# Patient Record
Sex: Male | Born: 1945 | Race: White | Hispanic: No | Marital: Single | Smoking: Former smoker
Health system: Southern US, Community
[De-identification: ages and names within clinical notes are randomized; demographics above are authoritative.]

## PROBLEM LIST (undated history)

## (undated) DIAGNOSIS — I4891 Unspecified atrial fibrillation: Secondary | ICD-10-CM

## (undated) HISTORY — PX: OTHER SURGICAL HISTORY: SHX169

## (undated) HISTORY — DX: Unspecified atrial fibrillation: I48.91

## (undated) HISTORY — PX: ABLATION: SHX5711

---

## 1998-09-30 ENCOUNTER — Ambulatory Visit (HOSPITAL_COMMUNITY): Admission: RE | Admit: 1998-09-30 | Discharge: 1998-09-30 | Payer: Self-pay | Admitting: Cardiovascular Disease

## 1998-10-14 ENCOUNTER — Inpatient Hospital Stay (HOSPITAL_COMMUNITY): Admission: AD | Admit: 1998-10-14 | Discharge: 1998-10-17 | Payer: Self-pay | Admitting: Cardiovascular Disease

## 1998-10-19 ENCOUNTER — Emergency Department (HOSPITAL_COMMUNITY): Admission: EM | Admit: 1998-10-19 | Discharge: 1998-10-19 | Payer: Self-pay | Admitting: Emergency Medicine

## 1998-10-26 ENCOUNTER — Emergency Department (HOSPITAL_COMMUNITY): Admission: EM | Admit: 1998-10-26 | Discharge: 1998-10-26 | Payer: Self-pay

## 1998-11-16 ENCOUNTER — Ambulatory Visit (HOSPITAL_COMMUNITY): Admission: RE | Admit: 1998-11-16 | Discharge: 1998-11-16 | Payer: Self-pay | Admitting: Cardiovascular Disease

## 2004-06-15 ENCOUNTER — Ambulatory Visit: Payer: Self-pay | Admitting: Cardiology

## 2004-06-19 ENCOUNTER — Ambulatory Visit: Payer: Self-pay

## 2004-06-19 ENCOUNTER — Ambulatory Visit: Payer: Self-pay | Admitting: Cardiology

## 2004-06-27 ENCOUNTER — Ambulatory Visit: Payer: Self-pay | Admitting: Cardiovascular Disease

## 2004-07-04 ENCOUNTER — Ambulatory Visit: Payer: Self-pay | Admitting: Cardiology

## 2004-07-10 ENCOUNTER — Ambulatory Visit: Payer: Self-pay | Admitting: Internal Medicine

## 2004-07-10 ENCOUNTER — Ambulatory Visit: Payer: Self-pay | Admitting: Cardiology

## 2004-07-18 ENCOUNTER — Ambulatory Visit: Payer: Self-pay | Admitting: Cardiology

## 2004-07-25 ENCOUNTER — Ambulatory Visit: Payer: Self-pay

## 2004-07-25 ENCOUNTER — Ambulatory Visit: Payer: Self-pay | Admitting: Cardiology

## 2004-08-01 ENCOUNTER — Ambulatory Visit: Payer: Self-pay | Admitting: Internal Medicine

## 2004-08-01 ENCOUNTER — Ambulatory Visit (HOSPITAL_COMMUNITY): Admission: RE | Admit: 2004-08-01 | Discharge: 2004-08-01 | Payer: Self-pay | Admitting: Internal Medicine

## 2004-08-08 ENCOUNTER — Ambulatory Visit: Payer: Self-pay | Admitting: Cardiology

## 2004-09-04 ENCOUNTER — Ambulatory Visit: Payer: Self-pay | Admitting: Internal Medicine

## 2006-07-22 ENCOUNTER — Ambulatory Visit (HOSPITAL_COMMUNITY): Admission: RE | Admit: 2006-07-22 | Discharge: 2006-07-22 | Payer: Self-pay | Admitting: *Deleted

## 2008-05-12 ENCOUNTER — Telehealth (INDEPENDENT_AMBULATORY_CARE_PROVIDER_SITE_OTHER): Payer: Self-pay | Admitting: *Deleted

## 2008-08-28 IMAGING — US US RENAL
1 series · 14 of 25 positions shown · non-contrast
Comparison: No prior exams are available for comparison.

CLINICAL DATA: 60-year-old with question of kidney stone.   Right flank pain for three days. Hematuria on [REDACTED].
 RENAL/URINARY TRACT ULTRASOUND:
TECHNIQUE: Complete ultrasound examination of the urinary tract was performed including evaluation of the kidneys, renal collecting systems, and urinary bladder.

[Series 1: unknown · 0.30mm/px · 14 of 47 slices shown]
[im 1/47]
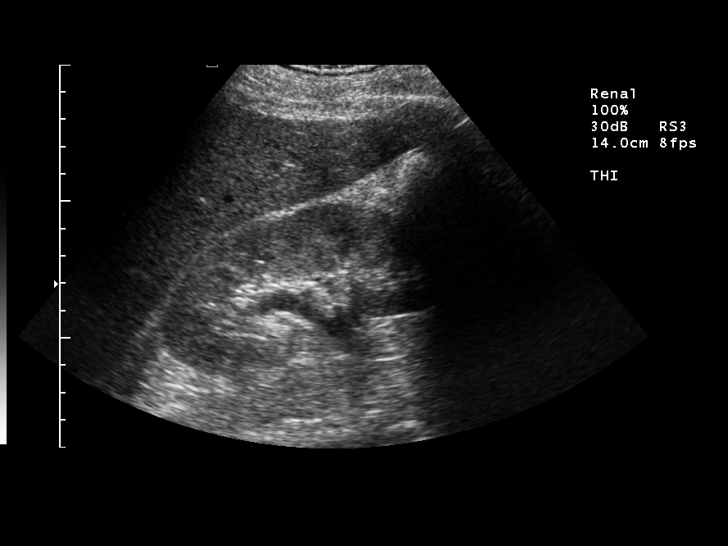
[im 4/47]
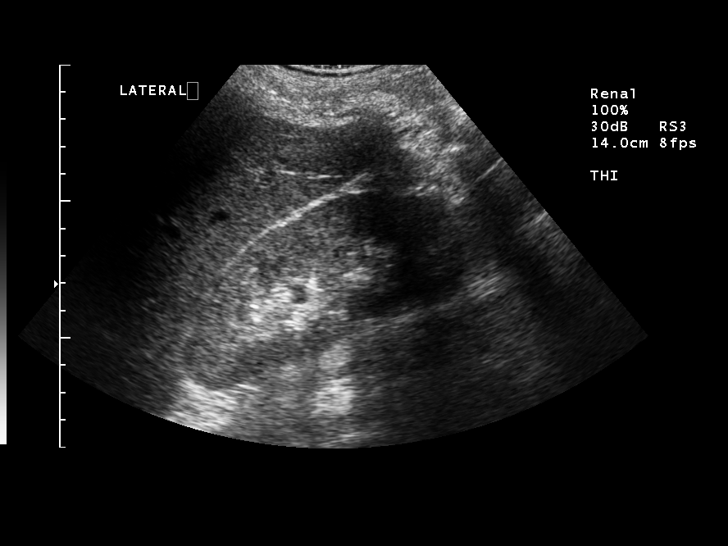
[im 8/47]
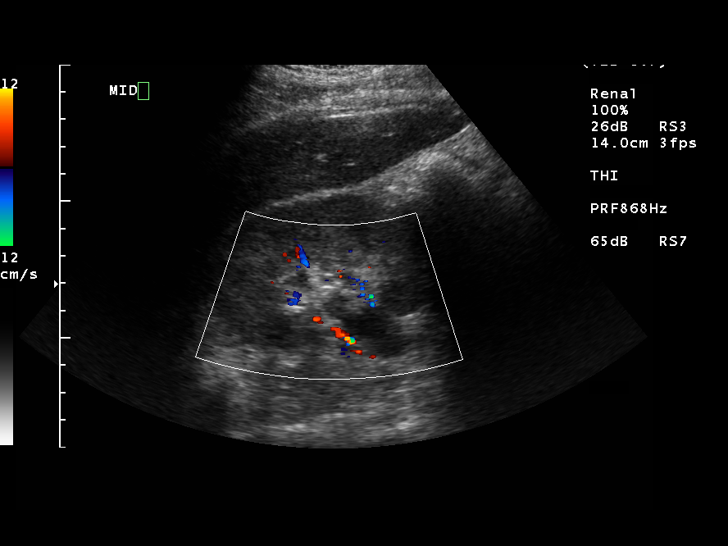
[im 12/47]
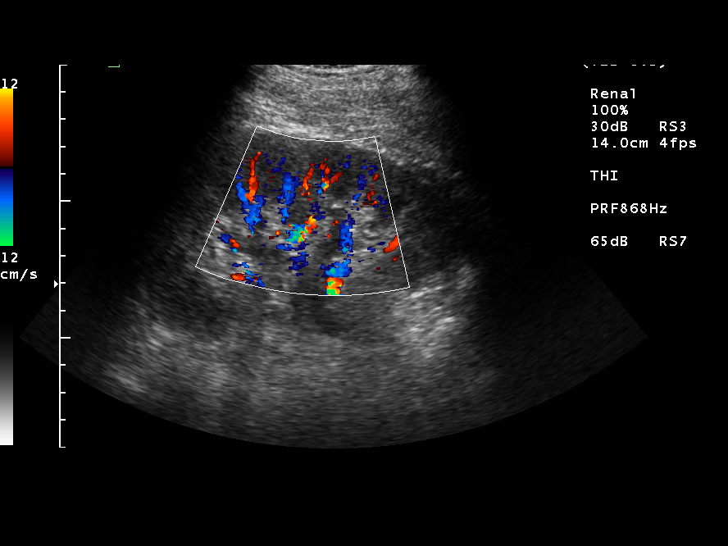
[im 16/47]
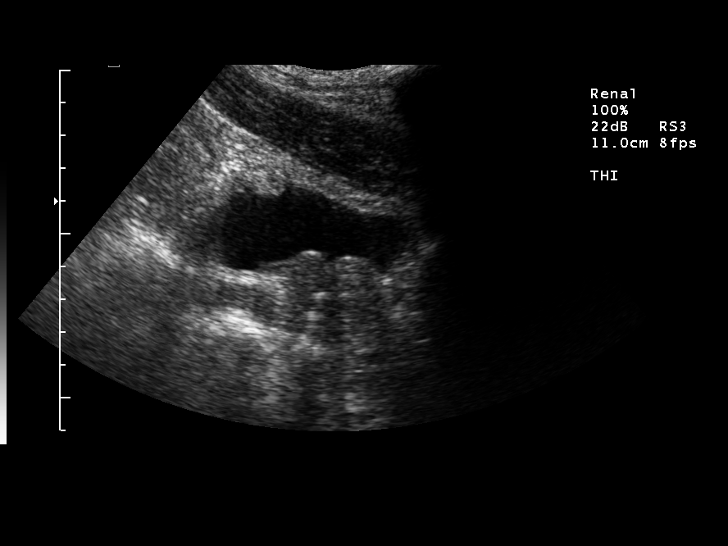
[im 18/47]
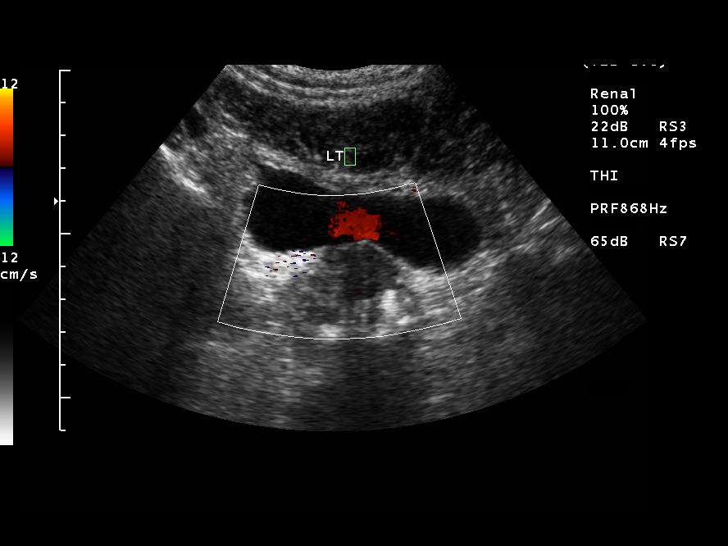
[im 22/47]
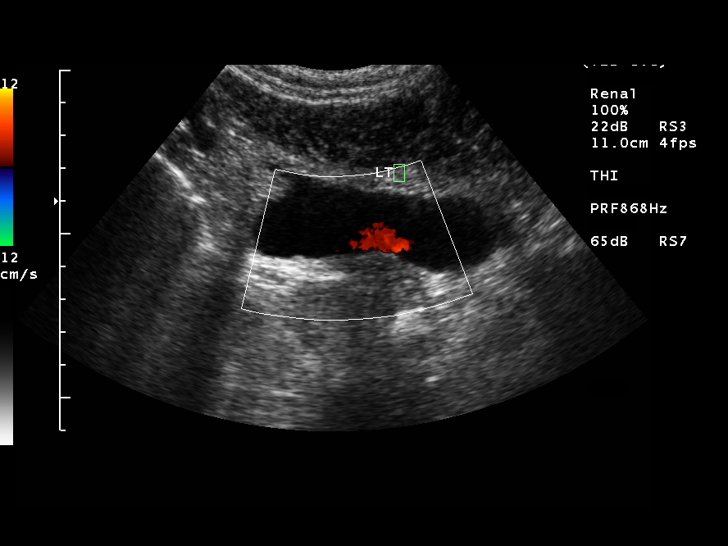
[im 25/47]
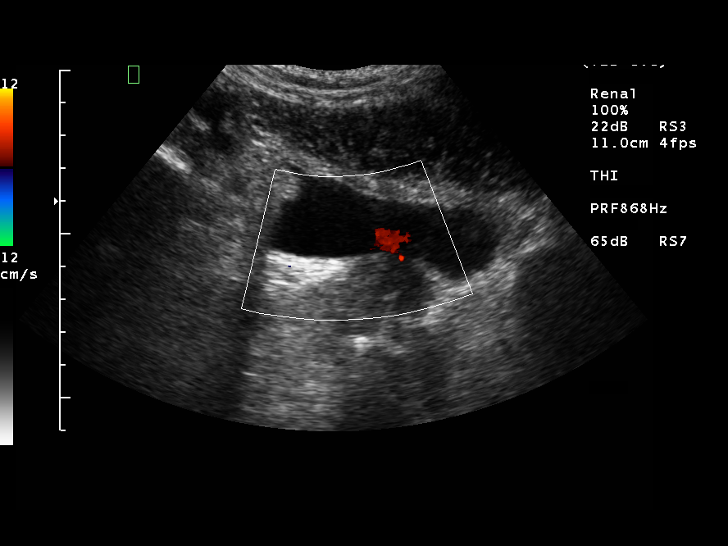
[im 29/47]
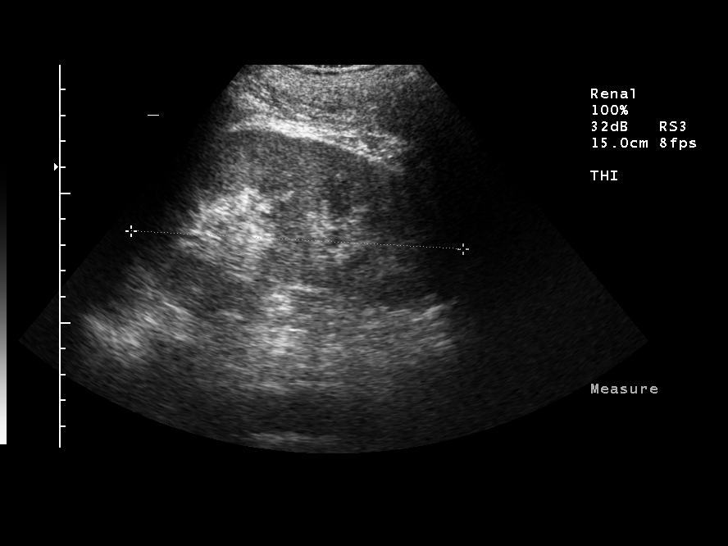
[im 31/47]
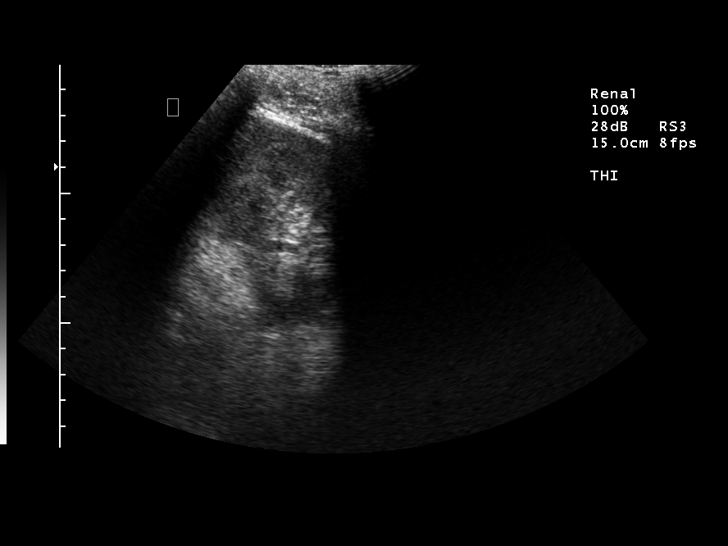
[im 35/47]
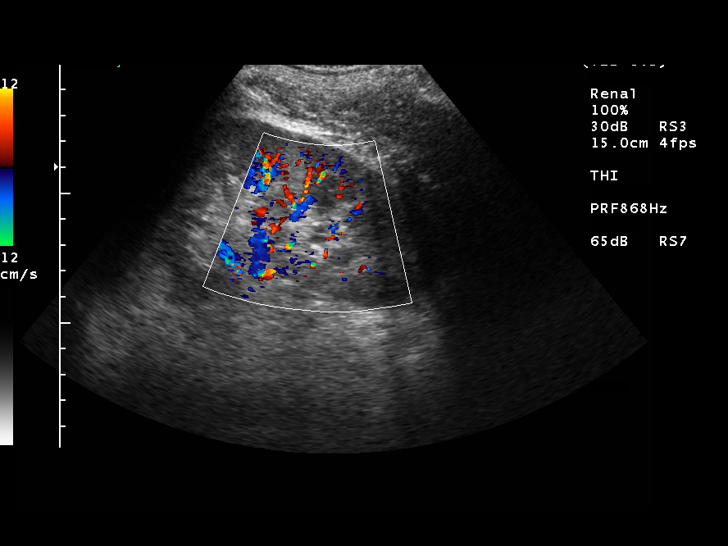
[im 39/47]
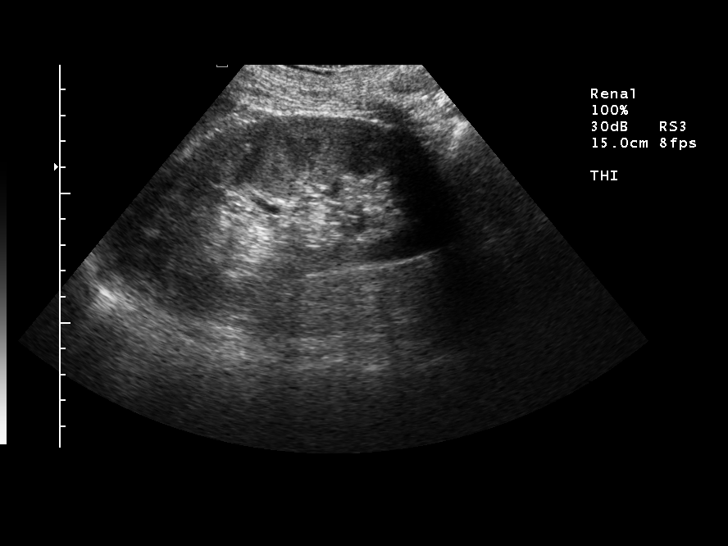
[im 43/47]
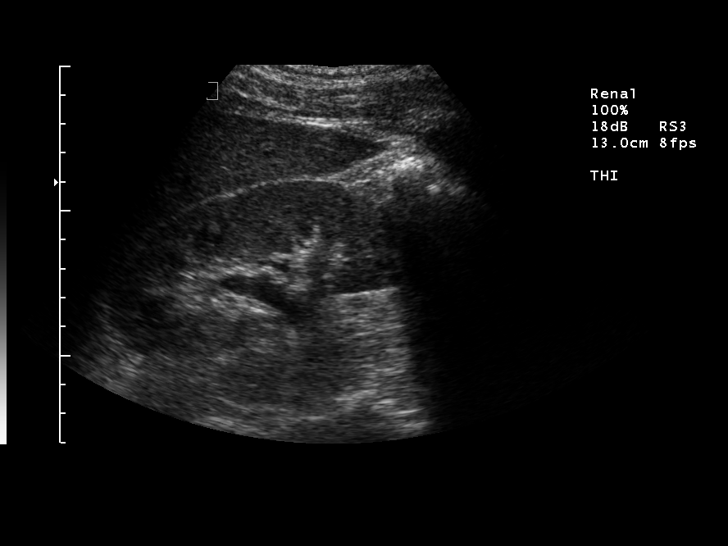
[im 47/47]
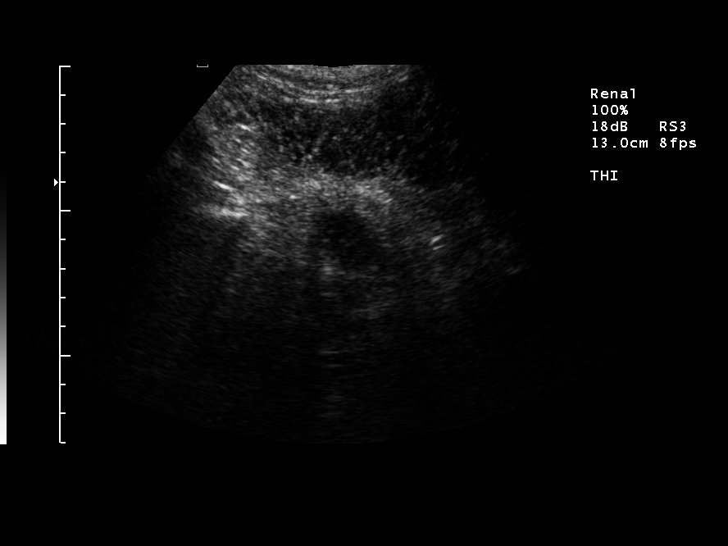

[14 of 25 positions shown; findings below may reference images not displayed]

FINDINGS: The right kidney is 12.4 cm in length. There is mild right hydronephrosis. Left kidney is 12.8 cm in length. No evidence for hydronephrosis on the left. No renal masses are identified. The bladder has a normal appearance. Bilateral ureteral jets are identified.
IMPRESSION: Mild right hydronephrosis. No evidence for renal mass. Presence of right ureteral jet excludes complete obstruction.

## 2010-05-26 NOTE — Op Note (Signed)
NAME:  Ryan Navarro, HORNBACK NO.:  [id number]   MEDICAL RECORD NO.:  [id number]          PATIENT TYPE:  OIB   LOCATION:  3729                         FACILITY:  [facility]   PHYSICIAN:  Duke Salvia, M.D.  DATE OF BIRTH:  15-Jul-1945   DATE OF PROCEDURE:  08/01/2004  DATE OF DISCHARGE:  08/01/2004                                 OPERATIVE REPORT   PREOPERATIVE DIAGNOSIS:  Atrial flutter.   POSTOPERATIVE DIAGNOSIS:  Sinus rhythm.   PROCEDURE:  Invasive electrophysiological study, arrhythmia mapping,  radiofrequency catheter ablation.   Following obtaining informed consent, the patient was brought to the  electrophysiology laboratory and placed on the fluoroscopic table in the  supine position.  After routine prep and drape, cardiac catheterization was  performed with local anesthesia and conscious sedation.  Noninvasive blood  pressure monitoring and transcutaneous oxygen saturation monitoring and end  tidal CO2 monitoring were performed continuously throughout the procedure.  Following the procedure, the catheters were removed, hemostasis was  obtained, and the patient was transferred to the floor in stable condition.   CATHETERS:  5 French quadripolar catheter was inserted via the left femoral  vein to the AV junction.  6 French octapolar catheter was inserted via the right femoral vein to the  coronary sinus.  7 [place] dual decapolar catheter was inserted via the left femoral vein to  the tricuspid annulus.  8 French 8 mm deflectable tip ablation catheter was inserted via the right  femoral vein using an SL2 sheath to map the sites into the posterior septal  space.   RESULTS:  Service electrocardiogram.  Initial rhythm is atrial flutter; AA interval 237 milliseconds; RR interval  675 milliseconds; QRS duration 73 milliseconds; QT interval 374  milliseconds; and unfortunately the internal electrogram measurements were  not recorded on the sheet.   Rhythm:   Sinus; RR interval 1293 milliseconds; PR 218 milliseconds; QRS  duration 103 milliseconds; QT interval 465 milliseconds; P wave duration 161  milliseconds; AH interval 155 milliseconds; HA interval 53 milliseconds.   AV Wenckebach was 400 milliseconds.  VA Wenckebach was 400 milliseconds.   The AV nodal affect was refractory at 600 milliseconds was 400 milliseconds  and AV nodal conduction was continuous.   No evidence of an accessory pathway was identified.   Ventricular response to programmed stimulation was normal for incremental  ventricular pacing.   Arrhythmias induced:  The patient presented to the lab in atrial flutter.  Electrogram mapping demonstrated counter clockwise rotation of the tricuspid  annulus.  Radiofrequency energy was delivered at the caval tricuspid isthmus  at approximately 6 o'clock on the annulus.  This interrupted atrial flutter  and subsequently resulted in the generation of bidirectional cavo-tricuspid  isthmus block.  Electrogram mapping further was undertaken to make sure that  all electrograms along the ablation line were obliterated.   Fluoroscopy time:  A total of 6 minutes and 56 seconds of fluoroscopy time  was utilized at 7 1/2 frames per second.   Radiofrequency energy:  A total of 5 minutes and 52 seconds of RF energy was  applied.  IMPRESSION:  1.  Normal sinus function apart from bradycardia.  2.  Sustained atrial flutter.  3.  Normal AV nodal function.  4.  No accessory pathway.  5.  Normal His system function.  6.  Normal ventricular response to programmed stimulation.   SUMMARY AND CONCLUSION:  The results of electrophysiological testing  identified typical counter clockwise atrial flutter as the mechanism  underlying the patient's clinical arrhythmia.  Radiofrequency energy  delivered at the cavo-tricuspid isthmus at approximately 6 o'clock  interrupted atrial flutter and subsequently eliminated the substrate from  the patient's  atrial arrhythmias.  The patient will be observed overnight,  maintained on Coumadin for about three months, endocarditis prophylaxis will  be observed for the interim.       SCK/MEDQ  D:  08/04/2004  T:  08/04/2004  Job:  [id number]

## 2010-05-26 NOTE — Discharge Summary (Signed)
Ryan Navarro, Ryan Navarro NO.:  [id number]   MEDICAL RECORD NO.:  [id number]          PATIENT TYPE:  OIB   LOCATION:  3729                         FACILITY:  [facility]   PHYSICIAN:  Duke Salvia, M.D.  DATE OF BIRTH:  July 26, 1945   DATE OF ADMISSION:  08/01/2004  DATE OF DISCHARGE:  08/01/2004                                 DISCHARGE SUMMARY   DISCHARGE DIAGNOSIS:  1.  Discharging the same day as electrophysiology study with successful      radiofrequency catheter ablation of atypical atrial flutter.  2.  Recurrence of symptomatic atrial flutter (palpitations, decreased      exercise), despite Sotalol therapy.  3.  Previous DCCV.  4.  Coumadin therapy.  5.  Echocardiogram demonstrates normal left ventricular function.   PROCEDURE:  August 01, 2004, successful radiofrequency catheter ablation of  typical atrial flutter by Dr. Sherryl Manges, atrial flutter has a counter  clockwise rotation.   DISCHARGE DISPOSITION:  Ryan Navarro discharged the same day, he has no  particular pain at the catheterization site, he is maintaining sinus rhythm,  he goes home on Coumadin for the next three months.  He is able to shower.  He is to call [phone] if he experiences pain or swelling at the cath site.  He is asked not to drive for the next two days and not to engage in any  strenuous activities for the next two weeks.  For pain control, Tylenol 325  mg 1-2 tablets every 4-6 hours.   DISCHARGE MEDICATIONS:  He goes home on the following medications:  1.  Allopurinol 300 mg daily.  2.  Multi-vitamin daily.  3.  Enteric coated aspirin 325 mg daily.  4.  Coumadin to take as before this procedure.  5.  He is to discontinue Sotalol.   Of note, if he plans dental work even just teeth cleaning up to and through  December 2006, he is to call the office at [phone] for antibiotic coverage.   FOLLOW UP:  1.  [facility] at [address], Coumadin Clinic Tuesday,  August 1, at 8:45 a.m., and he will present to the Coumadin Clinic      Friday, August 25, at 11:30, and he will see Dr. Graciela Husbands the same day,      Friday, August 25, at 11:50.   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  He has undergone DC  cardioversions and has been treated with Sotalol.  He has done well on  Sotalol for approximately five years.  When in atrial flutter, he is quite  symptomatic, he has palpitations, feeling that his heart is churning and  twisting in his chest and, also, exercise intolerance.  His first  presentation was with chest pain in 2000.  Cardiolite study at that time was  negative.  A recent echocardiogram shows normal left ventricular function.  Electrocardiogram from June 15, 2004, shows atrial flutter that looks typical  and very possibly amenable to radiofrequency catheter ablation.  The  patient  was seen by Dr. Sherryl Manges.  The risks and benefits of ablation have been  discussed with the patient.  The patient wishes to proceed and, in this  case, if successful, he will no longer need to take Sotalol and after three  months can come off his Coumadin.   HOSPITAL COURSE:  The patient presented electively  July 25 for  radiofrequency catheter ablation of typical atrial flutter, this procedure  was successful.  The patient has maintained sinus rhythm although sinus  bradycardia in the post procedure period.  Discharged with the medications  and follow up as dictated above.  Temperature at the time of discharge is  97.5, blood pressure 108/72, pulse 45, oxygen saturation 97.5%.  His PT on  July 25 was 25.7, INR was 2.3.  Serum electrolytes show sodium 136,  potassium 3.8, chloride 103, carbonate 29, BUN 21, creatinine 0.9, glucose  113.  CBC showed white cells 5.8, hemoglobin 14.2, hematocrit 41.2,  platelets 202.      Debbora Lacrosse   GM/MEDQ  D:  08/01/2004  T:  08/01/2004  Job:   [id number]   cc:   Charlton Haws, M.D.

## 2010-05-26 NOTE — H&P (Signed)
Ryan Navarro, Ryan Navarro   MEDICAL RECORD NO.:  [id number]          PATIENT TYPE:  OIB   LOCATION:  2899                         FACILITY:  [facility]   PHYSICIAN:  Duke Salvia, M.D.  DATE OF BIRTH:  June 12, 1945   DATE OF ADMISSION:  08/01/2004  DATE OF DISCHARGE:                                HISTORY & PHYSICAL   PRESENTING CIRCUMSTANCE:  I'm here for atrial flutter ablation.   HISTORY OF PRESENT ILLNESS:  Ryan Navarro is a 65 year old male.  He has a  history of atrial flutter first diagnosed in 2000.  He has had cardioversion  in the past.  He has been treated with sotalol, which helped for about six  years according to the patient.  In the last couple of months he noted  recurrence.  He could tell because he felt his heart palpitating.  At rest  he feels at times a churning sensation in the heart, and he has decreased  exercise  tolerance, which he notes most especially when climbing stairs.  He  presented to his cardiologist, Charlton Haws, M.D., confirming recurrence of  atrial flutter despite sotalol therapy.  The patient was started on Coumadin  and has been on it for the last two to three months.  The patient has seen  Dr. Graciela Husbands in the office July 3, a recommendation of radiofrequency catheter  ablation as treatment and possible cure of atrial flutter with subsequent  discontinuance of sotalol and Coumadin.   ALLERGIES:  No known drug allergies.   MEDICATIONS:  1.  Allopurinol 300 mg daily.  2.  Multivitamin daily.  3.  Aspirin 325 mg daily.  4.  Sotalol 100 mg twice daily.  5.  Coumadin per pharmacy indication.   PAST MEDICAL HISTORY:  1.  History of atrial flutter with recent recurrence in the last two to      three months, causing palpitation and exercise intolerance.  2.  Sotalol therapy, which has been largely successful for the last six      years.  3.  Gout.   SOCIAL HISTORY:  The patient is unmarried.  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  He does  not smoke cigarettes, use alcohol or partake of recreational drugs.   REVIEW OF SYSTEMS:  The patient is not complaining of fevers, chills, night  sweats, uncontrolled weight loss or gain or adenopathy.  HEENT:  No  epistaxis, no hoarseness, no vertigo, no photophobia.  INTEGUMENT:  No  lesions, no nonhealing ulcerations.  CARDIOPULMONARY:  No history of chest  pain.  He has had an echocardiogram in the past, which showed normal left  ventricular function.  He is short of breath when exerting himself,  especially when he is experiencing atrial flutter.  He has no history of  presyncope or syncope, no lower extremity edema, no history of claudication.  He does have palpitation as described in the history of present illness.  UROGENITAL:  Nocturia but only one time a night.  No dysuria, no frequency,  no incontinence.  GASTROINTESTINAL:  No history of GI bleeding, no melena,  no bright red blood per rectum, no chronic heartburn.  NEUROPSYCHIATRIC:  No  unilateral weakness or numbness, no tingling, no anxiety or depression.  MUSCULOSKELETAL:  The patient has occasional outbreaks of gout, for which he  takes allopurinol and has good control.  He has no ongoing arthralgias or  effusions.  NEUROLOGIC:  No focal neurologic deficits, alert and oriented  x3.  Other systems all negative.   PHYSICAL EXAMINATION:  GENERAL:  The patient is alert and oriented x3.  He  is in no acute distress and comfortable.  HEENT:  Eyes:  Pupils equal, round, and reactive to light, extraocular  movements intact.  Nares without discharge.  NECK:  Supple, no jugular venous distention, no thyromegaly, no carotid  bruits auscultated.  CHEST:  Lungs are clear to auscultation and percussion bilaterally.  CARDIAC:  Regular rate and rhythm without murmur on examination this  morning.  ABDOMEN:  Soft, nondistended, bowel sounds are  present, no  hepatosplenomegaly.  Abdominal aorta is nonpulsatile, nonexpansile.  EXTREMITIES:  No evidence of clubbing, cyanosis, or edema.  Dorsalis pedis  pulses are 4/4 bilaterally.  Radial pulses are 4/4 bilaterally.   IMPRESSION:  1.  Recurrent atrial flutter in patient, which is symptomatic with      tachypalpitations and exercise intolerance.  2.  History of atrial flutter controlled for the past six years on sotalol      therapy.  3.  History of prior direct current cardioversion.  4.  2-D echocardiogram showing normal left ventricular function.  The      patient has also had a stress test in the past, which shows no evidence      of ischemia.  There is an electrocardiogram dated June 15, 2004, which      demonstrates atrial flutter, which is a typical flutter, mean heart rate      of 82.   PLAN:  Electrophysiology study and radiofrequency catheter ablation of  atrial flutter on August 01, 2004.      Debbora Lacrosse   GM/MEDQ  D:  08/01/2004  T:  08/01/2004  Job:  [id number]

## 2011-06-19 ENCOUNTER — Ambulatory Visit (INDEPENDENT_AMBULATORY_CARE_PROVIDER_SITE_OTHER): Payer: MEDICARE | Admitting: Family Medicine

## 2011-06-19 VITALS — BP 118/79 | HR 73 | Temp 97.4°F | Resp 18 | Ht 73.0 in | Wt 199.0 lb

## 2011-06-19 DIAGNOSIS — S9030XA Contusion of unspecified foot, initial encounter: Secondary | ICD-10-CM

## 2011-06-19 DIAGNOSIS — R609 Edema, unspecified: Secondary | ICD-10-CM

## 2011-06-19 DIAGNOSIS — R300 Dysuria: Secondary | ICD-10-CM

## 2011-06-19 LAB — POCT URINALYSIS DIPSTICK
Bilirubin, UA: NEGATIVE
Blood, UA: NEGATIVE
Glucose, UA: NEGATIVE
Ketones, UA: NEGATIVE
Leukocytes, UA: NEGATIVE
Nitrite, UA: NEGATIVE
Protein, UA: NEGATIVE
Spec Grav, UA: 1.015
Urobilinogen, UA: 0.2
pH, UA: 6.5

## 2011-06-19 LAB — POCT UA - MICROSCOPIC ONLY
Bacteria, U Microscopic: NEGATIVE
Casts, Ur, LPF, POC: NEGATIVE
Crystals, Ur, HPF, POC: NEGATIVE
Mucus, UA: NEGATIVE
Yeast, UA: NEGATIVE

## 2011-06-19 MED ORDER — DOXYCYCLINE HYCLATE 100 MG PO TABS: 100.0000 mg | ORAL_TABLET | Freq: Two times a day (BID) | ORAL | Status: AC

## 2011-06-19 NOTE — Progress Notes (Signed)
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 He had atrial fibrillation which was corrected with the ablation procedure but when he woke he had ecchymosis in his right groin where the puncture site was and subsequently ecchymosis all the way down his right leg. He also had some ecchymosis in the left thigh.  During the procedure he was catheterized, and following the procedure and subsequently he has had dysuria. He's had no fever.  Patient is concerned about 2 pustular areas on his right posterior calf as well as anterior erythema along the anterior tibial border. He has been concerned about the extent of the ecchymosis which is resolving somewhat and is now located primarily in the right foot. Patient has had urine and blood work to better characterize the leg swelling but this is been nondiagnostic. He also had a venous Doppler. This was negative  Objective: No acute distress Marked ecchymosis and swelling of the right foot with good pedal pulse. Nontender groin 2 pustular areas are noted in the right posterior calf and measure about 1/2 cm in diameter each.  Results for orders placed in visit on 06/19/11  POCT UA - MICROSCOPIC ONLY      Component Value Range   WBC, Ur, HPF, POC 0-1     RBC, urine, microscopic 0-1     Bacteria, U Microscopic neg     Mucus, UA neg     Epithelial cells, urine per micros 0-1     Crystals, Ur, HPF, POC neg     Casts, Ur, LPF, POC neg     Yeast, UA neg    POCT URINALYSIS DIPSTICK      Component Value Range   Color, UA yellow     Clarity, UA clear     Glucose, UA neg     Bilirubin, UA neg     Ketones, UA neg     Spec Grav, UA 1.015     Blood, UA neg     pH, UA 6.5     Protein, UA neg     Urobilinogen, UA 0.2     Nitrite, UA neg     Leukocytes, UA Negative     Assessment: Suspect patient has some urethral trauma and perhaps early MRSA  infection.  Plan: Doxycycline 100 twice a day x7 days, continue right leg elevation and recheck if symptoms persist or worsen He will make an appointment for followup.  We spent 40 minutes face-to-face discussing patient's problems and his need to get a primary care provider. He quit date: 10 different offices today without success. I agreed to take him on as a patient.

## 2011-06-21 LAB — URINE CULTURE
Colony Count: NO GROWTH
Organism ID, Bacteria: NO GROWTH

## 2011-09-11 ENCOUNTER — Other Ambulatory Visit: Payer: Self-pay | Admitting: Family Medicine

## 2011-09-21 DIAGNOSIS — I4891 Unspecified atrial fibrillation: Secondary | ICD-10-CM | POA: Insufficient documentation

## 2011-11-06 ENCOUNTER — Encounter: Payer: Self-pay | Admitting: Family Medicine

## 2011-11-06 DIAGNOSIS — I4891 Unspecified atrial fibrillation: Secondary | ICD-10-CM

## 2011-12-24 ENCOUNTER — Ambulatory Visit (INDEPENDENT_AMBULATORY_CARE_PROVIDER_SITE_OTHER): Payer: Medicare Other | Admitting: Family Medicine

## 2011-12-24 VITALS — BP 135/83 | HR 64 | Temp 98.0°F | Resp 16 | Ht 74.0 in | Wt 204.4 lb

## 2011-12-24 DIAGNOSIS — L089 Local infection of the skin and subcutaneous tissue, unspecified: Secondary | ICD-10-CM

## 2011-12-24 DIAGNOSIS — Z22322 Carrier or suspected carrier of Methicillin resistant Staphylococcus aureus: Secondary | ICD-10-CM

## 2011-12-24 MED ORDER — MUPIROCIN 2 % EX OINT: TOPICAL_OINTMENT | Freq: Three times a day (TID) | CUTANEOUS | Status: DC

## 2011-12-24 MED ORDER — DOXYCYCLINE HYCLATE 100 MG PO TABS: 100.0000 mg | ORAL_TABLET | Freq: Two times a day (BID) | ORAL | Status: DC

## 2011-12-24 NOTE — Progress Notes (Signed)
[facility]:  Office Visit  Chief Complaint:  Chief Complaint  Patient presents with  . Mass    bump on left upper knee history of MRSA been there since Thurs    HPI: Ryan Navarro. is a 66 y.o. male who complains of  MRSA skin infection, has had it x 3 years, this episode x 5 days. He squeezed it and started having paina nd was worse. He thinks he may have contracted it at the [facility] pool 3 years ago. Denies fevers, chills  Past Medical History  Diagnosis Date  . Atrial fibrillation    Past Surgical History  Procedure Date  . Ablation     May 28,2013   History   Social History  . Marital Status: Single    Spouse Name: N/A    Number of Children: N/A  . Years of Education: N/A   Social History Main Topics  . Smoking status: Former Smoker -- 2.0 packs/day for 20 years    Types: Cigarettes    Quit date: 11/19/1983  . Smokeless tobacco: None  . Alcohol Use: No  . Drug Use: No  . Sexually Active: None   Other Topics Concern  . None   Social History Narrative  . None   Family History  Problem Relation Age of Onset  . Cancer Mother   . Heart disease Father    No Known Allergies Prior to Admission medications   Medication Sig Start Date End Date Taking? Authorizing Provider  allopurinol (ZYLOPRIM) 100 MG tablet Take 100 mg by mouth daily.   Yes Historical Provider, MD  allopurinol (ZYLOPRIM) 300 MG tablet TAKE 1 TABLET BY MOUTH TWICE A DAY 09/11/11  Yes Ryan M Dunn, PA-C  dofetilide (TIKOSYN) 250 MCG capsule Take 250 mcg by mouth 2 (two) times daily.   Yes Historical Provider, MD  enalapril (VASOTEC) 5 MG tablet Take 5 mg by mouth daily.   Yes Historical Provider, MD  furosemide (LASIX) 20 MG tablet Take 10 mg by mouth daily.   Yes Historical Provider, MD  magnesium oxide (MAG-OX) 400 MG tablet Take 400 mg by mouth daily.   Yes Historical Provider, MD  Multiple Vitamins-Minerals (MULTIVITAMIN WITH MINERALS) tablet Take 1 tablet by mouth daily.    Yes Historical Provider, MD  rivaroxaban (XARELTO) 10 MG TABS tablet Take 15 mg by mouth daily.    Historical Provider, MD  Tamsulosin HCl (FLOMAX) 0.4 MG CAPS Take by mouth.    Historical Provider, MD     ROS: The patient denies fevers, chills, night sweats, unintentional weight loss, chest pain, palpitations, wheezing, dyspnea on exertion, nausea, vomiting, abdominal pain, dysuria, hematuria, melena, numbness, weakness, or tingling.   All other systems have been reviewed and were otherwise negative with the exception of those mentioned in the HPI and as above.    PHYSICAL EXAM: Filed Vitals:   12/24/11 1547  BP: 135/83  Pulse: 64  Temp: 98 F (36.7 C)  Resp: 16   Filed Vitals:   12/24/11 1547  Height: 6\' 2"  (1.88 m)  Weight: 204 lb 6.4 oz (92.715 kg)   Body mass index is 26.24 kg/(m^2).  General: Alert, no acute distress HEENT:  Normocephalic, atraumatic, oropharynx patent.  Cardiovascular:  Regular rate and rhythm, no rubs murmurs or gallops.  No Carotid bruits, radial pulse intact. No pedal edema.  Respiratory: Clear to auscultation bilaterally.  No wheezes, rales, or rhonchi.  No cyanosis, no use of accessory musculature GI: No organomegaly,  abdomen is soft and non-tender, positive bowel sounds.  No masses. Skin: + erythematous lesions on bilateral knees, left 1x1inches, right 10 mmx 10 mm, no dc Neurologic: Facial musculature symmetric. Psychiatric: Patient is appropriate throughout our interaction. Lymphatic: No cervical lymphadenopathy Musculoskeletal: Gait intact.   LABS: Results for orders placed in visit on 06/19/11  POCT UA - MICROSCOPIC ONLY      Component Value Range   WBC, Ur, HPF, POC 0-1     RBC, urine, microscopic 0-1     Bacteria, U Microscopic neg     Mucus, UA neg     Epithelial cells, urine per micros 0-1     Crystals, Ur, HPF, POC neg     Casts, Ur, LPF, POC neg     Yeast, UA neg    POCT URINALYSIS DIPSTICK      Component Value Range   Color,  UA yellow     Clarity, UA clear     Glucose, UA neg     Bilirubin, UA neg     Ketones, UA neg     Spec Grav, UA 1.015     Blood, UA neg     pH, UA 6.5     Protein, UA neg     Urobilinogen, UA 0.2     Nitrite, UA neg     Leukocytes, UA Negative    URINE CULTURE      Component Value Range   Colony Count NO GROWTH     Organism ID, Bacteria NO GROWTH       EKG/XRAY:   Primary read interpreted by Dr. Conley Rolls at [facility].   ASSESSMENT/PLAN: Encounter Diagnoses  Name Primary?  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 Skin infection    Rx Doxycycline Rx Bactroban HibaClens prn Avoid communal pool until resolves F/u prn    Shanterica Biehler PHUONG, DO 12/24/2011 4:21 PM

## 2012-01-14 ENCOUNTER — Other Ambulatory Visit: Payer: Self-pay | Admitting: Family Medicine

## 2012-02-19 ENCOUNTER — Other Ambulatory Visit: Payer: Self-pay | Admitting: Physician Assistant

## 2012-12-30 ENCOUNTER — Ambulatory Visit (INDEPENDENT_AMBULATORY_CARE_PROVIDER_SITE_OTHER): Payer: Medicare Other | Admitting: Family Medicine

## 2012-12-30 VITALS — BP 126/72 | HR 54 | Temp 98.7°F | Resp 14 | Ht 73.0 in | Wt 205.0 lb

## 2012-12-30 DIAGNOSIS — N419 Inflammatory disease of prostate, unspecified: Secondary | ICD-10-CM

## 2012-12-30 DIAGNOSIS — Z0000 Encounter for general adult medical examination without abnormal findings: Secondary | ICD-10-CM

## 2012-12-30 LAB — POCT URINALYSIS DIPSTICK
Bilirubin, UA: NEGATIVE
Blood, UA: NEGATIVE
Glucose, UA: NEGATIVE
Ketones, UA: NEGATIVE
Leukocytes, UA: NEGATIVE
Nitrite, UA: NEGATIVE
Protein, UA: NEGATIVE
Spec Grav, UA: 1.025
Urobilinogen, UA: 0.2
pH, UA: 6.5

## 2012-12-30 LAB — GLUCOSE, POCT (MANUAL RESULT ENTRY): POC Glucose: 71 mg/dl (ref 70–99)

## 2012-12-30 LAB — IFOBT (OCCULT BLOOD): IFOBT: POSITIVE

## 2012-12-30 MED ORDER — DOXYCYCLINE HYCLATE 100 MG PO TABS: 100.0000 mg | ORAL_TABLET | Freq: Two times a day (BID) | ORAL | Status: DC

## 2012-12-30 NOTE — Patient Instructions (Signed)
Benign Prostatic Hyperplasia An enlarged prostate (benign prostatic hyperplasia) is common in older men. You may experience the following:  Weak urine stream.  Dribbling.  Feeling like the bladder has not emptied completely.  Difficulty starting urination.  Getting up frequently at night to urinate.  Urinating more frequently during the day. HOME CARE INSTRUCTIONS  Monitor your prostatic hyperplasia for any changes. The following actions may help to alleviate any discomfort you are experiencing:  Give yourself time when you urinate.  Stay away from alcohol.  Avoid beverages containing caffeine, such as coffee, tea, and colas, because they can make the problem worse.  Avoid decongestants, antihistamines, and some prescription medicines that can make the problem worse.  Follow up with your health care provider for further treatment as recommended. SEEK MEDICAL CARE IF:  You are experiencing progressive difficulty voiding.  Your urine stream is progressively getting narrower.  You are awaking from sleep with the urge to void more frequently.  You are constantly feeling the need to void.  You experience loss of urine, especially in small amounts. SEEK IMMEDIATE MEDICAL CARE IF:   You develop increased pain with urination or are unable to urinate.  You develop severe abdominal pain, vomiting, a high fever, or fainting.  You develop back pain or blood in your urine. MAKE SURE YOU:   Understand these instructions.  Will watch your condition.  Will get help right away if you are not doing well or get worse. Document Released: 12/25/2004 Document Revised: 08/27/2012 Document Reviewed: 05/27/2012 [facility] Patient Information 2014 [place], [place]. Prostatitis The prostate gland is about the size and shape of a walnut. It is located just below your bladder. It produces one of the components of semen, which is made up of sperm and the fluids that help nourish and transport it  out from the testicles. Prostatitis is redness, soreness, and swelling (inflammation) of the prostate gland.  There are 3 types of prostatitis:  Acute bacterial prostatitis This is the least common type of prostatitis. It starts quickly and usually leads to a bladder infection. It can occur at any age.  Chronic bacterial prostatitis This is a persistent bacterial infection in the prostate. It usually develops from repeated acute bacterial prostatitis or acute bacterial prostatitis that was not properly treated. It can occur in men of any age but is most common in middle-aged men whose prostate has begun to enlarge.   Chronic prostatitis chronic pelvic pain syndrome This is the most common type of prostatitis. It is inflammation of the prostate gland that is not caused by a bacterial infection. The cause is unknown. CAUSES The cause of acute and chronic bacterial prostatitis is a bacterial infection. The exact cause of chronic prostatitis and chronic pelvic pain syndrome and asymptomatic inflammatory prostatitis is unknown.  SYMPTOMS  Symptoms can vary depending upon the type of prostatitis that exists. There can also be overlap in symptoms. Possible symptoms for each type of prostatitis are listed below. Acute bacterial prostatitis  Painful urination.  Fever or chills.  Muscle or joint pains.  Low back pain.  Low abdominal pain.  Inability to empty bladder completely.  Sudden urge to urinate.  Frequent urination.  Difficulty starting urine stream.  Weak urine stream.  Discharge from the urethra.  Dribbling after urination.  Rectal pain.  Pain in the testicles, penis, or tip of the penis.  Pain in the space between the anus and scrotum (perineum).  Problems with sexual function.  Painful ejaculation.  Bloody semen. Chronic  bacterial prostatitis  The symptoms are similar to those of acute bacterial prostatitis, but they usually are much less severe. Fever, chills,  and muscle and joint pain are not associated with chronic bacterial prostatitis. Chronic prostatitis chronic pelvic pain syndrome  Symptoms typically include a dull ache in the scrotum and the perineum. DIAGNOSIS  In order to diagnose prostatitis, your caregiver will ask about your symptoms. If acute or chronic bacterial prostatitis is suspected, a urine sample will be taken and tested (urinalysis). This is to see if there is bacteria in your urine. If the urinalysis result is negative for bacteria, your caregiver may use a finger to feel your prostate (digital rectal exam). This exam helps your caregiver determine if your prostate is swollen and tender. TREATMENT  Treatment for prostatitis depends on the cause. If a bacterial infection is the cause, it can be treated with antibiotic medicine. In cases of chronic bacterial prostatitis, the use of antibiotics for up to 1 month may be necessary. Your caregiver may instruct you to take sitz baths to help relieve pain. A sitz bath is a bath of hot water in which your hips and buttocks are under water. HOME CARE INSTRUCTIONS   Take all medicines as directed by your caregiver.  Take sitz baths as directed by your caregiver. SEEK MEDICAL CARE IF:   Your symptoms get worse, not better.  You have a fever. SEEK IMMEDIATE MEDICAL CARE IF:   You have chills.  You feel nauseous or vomit.  You feel lightheaded or faint.  You are unable to urinate.  You have blood or blood clots in your urine. Document Released: 12/23/1999 Document Revised: 03/19/2011 Document Reviewed: 07/14/2012 [facility] Patient Information 2014 [place], [place]. Health Maintenance, Males A healthy lifestyle and preventative care can promote health and wellness.  Maintain regular health, dental, and eye exams.  Eat a healthy diet. Foods like vegetables, fruits, whole grains, low-fat dairy products, and lean protein foods contain the nutrients you need without too many calories.  Decrease your intake of foods high in solid fats, added sugars, and salt. Get information about a proper diet from your caregiver, if necessary.  Regular physical exercise is one of the most important things you can do for your health. Most adults should get at least 150 minutes of moderate-intensity exercise (any activity that increases your heart rate and causes you to sweat) each week. In addition, most adults need muscle-strengthening exercises on 2 or more days a week.   Maintain a healthy weight. The body mass index (BMI) is a screening tool to identify possible weight problems. It provides an estimate of body fat based on height and weight. Your caregiver can help determine your BMI, and can help you achieve or maintain a healthy weight. For adults 20 years and older:  A BMI below 18.5 is considered underweight.  A BMI of 18.5 to 24.9 is normal.  A BMI of 25 to 29.9 is considered overweight.  A BMI of 30 and above is considered obese.  Maintain normal blood lipids and cholesterol by exercising and minimizing your intake of saturated fat. Eat a balanced diet with plenty of fruits and vegetables. Blood tests for lipids and cholesterol should begin at age 55 and be repeated every 5 years. If your lipid or cholesterol levels are high, you are over 50, or you are a high risk for heart disease, you may need your cholesterol levels checked more frequently.Ongoing high lipid and cholesterol levels should be treated with medicines, if  diet and exercise are not effective.  If you smoke, find out from your caregiver how to quit. If you do not use tobacco, do not start.  Lung cancer screening is recommended for adults aged 35 80 years who are at high risk for developing lung cancer because of a history of smoking. Yearly low-dose computed tomography (CT) is recommended for people who have at least a 30-pack-year history of smoking and are a current smoker or have quit within the past 15 years. A pack  year of smoking is smoking an average of 1 pack of cigarettes a day for 1 year (for example: 1 pack a day for 30 years or 2 packs a day for 15 years). Yearly screening should continue until the smoker has stopped smoking for at least 15 years. Yearly screening should also be stopped for people who develop a health problem that would prevent them from having lung cancer treatment.  If you choose to drink alcohol, do not exceed 2 drinks per day. One drink is considered to be 12 ounces (355 mL) of beer, 5 ounces (148 mL) of wine, or 1.5 ounces (44 mL) of liquor.  Avoid use of street drugs. Do not share needles with anyone. Ask for help if you need support or instructions about stopping the use of drugs.  High blood pressure causes heart disease and increases the risk of stroke. Blood pressure should be checked at least every 1 to 2 years. Ongoing high blood pressure should be treated with medicines if weight loss and exercise are not effective.  If you are 40 to 67 years old, ask your caregiver if you should take aspirin to prevent heart disease.  Diabetes screening involves taking a blood sample to check your fasting blood sugar level. This should be done once every 3 years, after age 81, if you are within normal weight and without risk factors for diabetes. Testing should be considered at a younger age or be carried out more frequently if you are overweight and have at least 1 risk factor for diabetes.  Colorectal cancer can be detected and often prevented. Most routine colorectal cancer screening begins at the age of 63 and continues through age 96. However, your caregiver may recommend screening at an earlier age if you have risk factors for colon cancer. On a yearly basis, your caregiver may provide home test kits to check for hidden blood in the stool. Use of a small camera at the end of a tube, to directly examine the colon (sigmoidoscopy or colonoscopy), can detect the earliest forms of colorectal  cancer. Talk to your caregiver about this at age 32, when routine screening begins. Direct examination of the colon should be repeated every 5 to 10 years through age 38, unless early forms of pre-cancerous polyps or small growths are found.  Hepatitis C blood testing is recommended for all people born from [birth year] through 1965 and any individual with known risks for hepatitis C.  Healthy men should no longer receive prostate-specific antigen (PSA) blood tests as part of routine cancer screening. Consult with your caregiver about prostate cancer screening.  Testicular cancer screening is not recommended for adolescents or adult males who have no symptoms. Screening includes self-exam, caregiver exam, and other screening tests. Consult with your caregiver about any symptoms you have or any concerns you have about testicular cancer.  Practice safe sex. Use condoms and avoid high-risk sexual practices to reduce the spread of sexually transmitted infections (STIs).  Use sunscreen. Apply sunscreen  liberally and repeatedly throughout the day. You should seek shade when your shadow is shorter than you. Protect yourself by wearing long sleeves, pants, a wide-brimmed hat, and sunglasses year round, whenever you are outdoors.  Notify your caregiver of new moles or changes in moles, especially if there is a change in shape or color. Also notify your caregiver if a mole is larger than the size of a pencil eraser.  A one-time screening for abdominal aortic aneurysm (AAA) and surgical repair of large AAAs by sound wave imaging (ultrasonography) is recommended for ages 40 to 8 years who are current or former smokers.  Stay current with your immunizations. Document Released: 06/23/2007 Document Revised: 04/21/2012 Document Reviewed: 05/22/2010 [facility] Patient Information 2014 [place], [place].

## 2012-12-30 NOTE — Progress Notes (Signed)
Subjective:    Patient ID: Ryan Lords., male    DOB: [birth year], 66 y.o.   MRN: [id number] This chart was scribed for Elvina Sidle, MD by Valera Castle, ED Scribe. This patient was seen in room 2 and the patient's care was started at 5:33 PM.  Chief Complaint  Patient presents with   Annual Exam    HPI Ryan Ambs. is a 67 y.o. male who presents to the [facility] requesting a physical exam. He cannot remember when he last had a physical. He reports recent surgery for atrial fibrillation. He reports his father died from heart disease. Pt denies having same disease. He denies h/o colonoscopy. He denies h/o flu vaccination.   Pt reports discomfort around his testicles. He reports he urinates frequently in the evenings, and states it feels like his testicles are swollen. He also reports that there has been some blood in his semen recently.   He reports that he swims and works out at [employer] 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 He is a former smoker, quit date 11/19/83, 2 PPD for 40 years.   He denies any other symptoms.   PCP Elvina Sidle, MD  Patient Active Problem List   Diagnosis Date Noted   Atrial fibrillation 09/21/2011   Past Medical History  Diagnosis Date   Atrial fibrillation    Past Surgical History  Procedure Laterality Date   Ablation      May 28,2013   No Known Allergies Prior to Admission medications   Medication Sig Start Date End Date Taking? Authorizing Provider  allopurinol (ZYLOPRIM) 300 MG tablet Take 1 tablet (300 mg total) by mouth 2 (two) times daily. Needs office visit/labs 02/19/12  Yes Heather M Marte, PA-C  dofetilide (TIKOSYN) 250 MCG capsule Take 250 mcg by mouth 2 (two) times daily.   Yes Historical Provider, MD  doxycycline (VIBRA-TABS) 100 MG tablet Take 1 tablet (100 mg total) by mouth 2 (two) times daily. 12/24/11  Yes Thao P Le, DO  enalapril (VASOTEC) 5 MG tablet Take 5 mg by mouth  daily.   Yes Historical Provider, MD  furosemide (LASIX) 20 MG tablet Take 10 mg by mouth daily.   Yes Historical Provider, MD  magnesium oxide (MAG-OX) 400 MG tablet Take 400 mg by mouth daily.   Yes Historical Provider, MD  Multiple Vitamins-Minerals (MULTIVITAMIN WITH MINERALS) tablet Take 1 tablet by mouth daily.   Yes Historical Provider, MD  allopurinol (ZYLOPRIM) 100 MG tablet Take 100 mg by mouth daily.    Historical Provider, MD  mupirocin ointment (BACTROBAN) 2 % Apply topically 3 (three) times daily. 12/24/11   Thao P Le, DO  rivaroxaban (XARELTO) 10 MG TABS tablet Take 15 mg by mouth daily.    Historical Provider, MD  Tamsulosin HCl (FLOMAX) 0.4 MG CAPS Take by mouth.    Historical Provider, MD   Family History  Problem Relation Age of Onset   Cancer Mother    Heart disease Father    History   Social History   Marital Status: Single    Spouse Name: N/A    Number of Children: N/A   Years of Education: N/A   Occupational History   Not on file.   Social History Main Topics   Smoking status: Former Smoker -- 2.00 packs/day for 20 years    Types: Cigarettes    Quit date: 11/19/1983   Smokeless tobacco: Not on file  Alcohol Use: No   Drug Use: No   Sexual Activity: Not on file   Other Topics Concern   Not on file   Social History Narrative   No narrative on file    Review of Systems  Genitourinary: Positive for frequency and scrotal swelling (bilateral). Negative for penile swelling and penile pain.       Blood in semen  All other systems reviewed and are negative.       Objective:   Physical Exam Nursing note and vitals reviewed. Constitutional: Pt is oriented to person, place, and time. Pt appears well-developed and well-nourished. No distress.  HENT:  Head: Normocephalic and atraumatic.  Eyes: EOM are normal. Pupils are equal, round, and reactive to light.  Neck: Neck supple. No tracheal deviation present.   BP 126/72   Pulse 54    Temp(Src) 98.7 F (37.1 C)   Resp 14   Ht 6\' 1"  (1.854 m)   Wt 205 lb (92.987 kg)   BMI 27.05 kg/m2   SpO2 95% General appearance: No acute distress, bearded man of average height, and trimmed body habitus HEENT: Unremarkable Neck: Supple no adenopathy or thyromegaly Chest: Clear Heart: Regular no murmur or gallop Abdomen: Soft nontender without HSM Genitalia: No hernia, normal testes and scrotum Rectal exam: Markedly enlarged boggy prostate, no hemorrhoids    Assessment & Plan:   Annual physical exam - Plan: POCT CBC, IFOBT POC (occult bld, rslt in office), POCT urinalysis dipstick, CBC with Differential, TSH, PSA, Lipid panel, Vit D  25 hydroxy (rtn osteoporosis monitoring), Ambulatory referral to Gastroenterology  Prostatitis - Plan: doxycycline (VIBRA-TABS) 100 MG tablet    Signed, Elvina Sidle, MD      I personally performed the services described in this documentation, which was scribed in my presence. The recorded information has been reviewed and is accurate.

## 2012-12-31 LAB — LIPID PANEL
Cholesterol: 230 mg/dL — ABNORMAL HIGH (ref 0–200)
HDL: 68 mg/dL (ref >39)
LDL Cholesterol: 140 mg/dL — ABNORMAL HIGH (ref 0–99)
Total CHOL/HDL Ratio: 3.4 Ratio
Triglycerides: 112 mg/dL (ref <150)
VLDL: 22 mg/dL (ref 0–40)

## 2012-12-31 LAB — CBC WITH DIFFERENTIAL/PLATELET
Basophils Absolute: 0 10*3/uL (ref 0.0–0.1)
Basophils Relative: 0 % (ref 0–1)
Eosinophils Absolute: 0.5 10*3/uL (ref 0.0–0.7)
Eosinophils Relative: 8 % — ABNORMAL HIGH (ref 0–5)
HCT: 42.5 % (ref 39.0–52.0)
Hemoglobin: 15.1 g/dL (ref 13.0–17.0)
Lymphocytes Relative: 22 % (ref 12–46)
Lymphs Abs: 1.3 10*3/uL (ref 0.7–4.0)
MCH: 31.3 pg (ref 26.0–34.0)
MCHC: 35.5 g/dL (ref 30.0–36.0)
MCV: 88 fL (ref 78.0–100.0)
Monocytes Absolute: 0.5 10*3/uL (ref 0.1–1.0)
Monocytes Relative: 9 % (ref 3–12)
Neutro Abs: 3.6 10*3/uL (ref 1.7–7.7)
Neutrophils Relative %: 61 % (ref 43–77)
Platelets: 234 10*3/uL (ref 150–400)
RBC: 4.83 MIL/uL (ref 4.22–5.81)
RDW: 14.3 % (ref 11.5–15.5)
WBC: 5.9 10*3/uL (ref 4.0–10.5)

## 2012-12-31 LAB — TSH: TSH: 3.13 u[IU]/mL (ref 0.350–4.500)

## 2012-12-31 LAB — PSA: PSA: 2.23 ng/mL (ref <=4.00)

## 2013-01-01 LAB — VITAMIN D 25 HYDROXY (VIT D DEFICIENCY, FRACTURES): Vit D, 25-Hydroxy: 42 ng/mL (ref 30–89)

## 2013-02-17 ENCOUNTER — Encounter: Payer: Self-pay | Admitting: Family Medicine

## 2013-03-25 ENCOUNTER — Encounter: Payer: Self-pay | Admitting: Family Medicine

## 2013-08-27 ENCOUNTER — Ambulatory Visit: Payer: Medicare Other | Admitting: Family Medicine

## 2013-08-27 ENCOUNTER — Encounter: Payer: Self-pay | Admitting: Family Medicine

## 2013-08-27 ENCOUNTER — Ambulatory Visit (INDEPENDENT_AMBULATORY_CARE_PROVIDER_SITE_OTHER): Payer: Commercial Managed Care - HMO | Admitting: Family Medicine

## 2013-08-27 VITALS — BP 120/76 | HR 64 | Temp 97.8°F | Resp 16 | Ht 73.5 in | Wt 191.0 lb

## 2013-08-27 DIAGNOSIS — N410 Acute prostatitis: Secondary | ICD-10-CM

## 2013-08-27 MED ORDER — DOXYCYCLINE HYCLATE 100 MG PO TABS: 100.0000 mg | ORAL_TABLET | Freq: Two times a day (BID) | ORAL | Status: DC

## 2013-08-27 NOTE — Patient Instructions (Signed)

## 2013-08-27 NOTE — Progress Notes (Signed)
This chart was scribed for Elvina Sidle, MD by Charline Bills, ED Scribe. The patient was seen in room 25. Patient's care was started at 12:14 PM.  Patient ID: Ryan Navarro. MRN: [id number], DOB: Dec 07, 1945, 68 y.o. Date of Encounter: 08/27/2013, 12:14 PM  Primary Physician: Elvina Sidle, MD  Chief Complaint: Groin Pain; Penis Pain; Hematospermia  HPI: 68 y.o. year old male with history below presents with groin pain onset 1 month ago. Pt reports associated penile pain, hematospermia and discomfort with urinating. He denies hematuria and fever. Pt is not currently taking antibiotics or blood thinners.  He had the same problem last December and it resolved with doxycycline.  He finds the blood in semen quite disconcerting.  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  He lives in [place] neighborhood  Last PSA test was done on 12/30/12 by Dr. Milus Glazier.   Past Medical History  Diagnosis Date  . Atrial fibrillation      Home Meds: Prior to Admission medications   Medication Sig Start Date End Date Taking? Authorizing Provider  allopurinol (ZYLOPRIM) 100 MG tablet Take 100 mg by mouth daily.    Historical Provider, MD  allopurinol (ZYLOPRIM) 300 MG tablet Take 1 tablet (300 mg total) by mouth 2 (two) times daily. Needs office visit/labs 02/19/12   Nelva Nay, PA-C  dofetilide (TIKOSYN) 250 MCG capsule Take 250 mcg by mouth 2 (two) times daily.    Historical Provider, MD  doxycycline (VIBRA-TABS) 100 MG tablet Take 1 tablet (100 mg total) by mouth 2 (two) times daily. 12/30/12   Elvina Sidle, MD  enalapril (VASOTEC) 5 MG tablet Take 5 mg by mouth daily.    Historical Provider, MD  furosemide (LASIX) 20 MG tablet Take 10 mg by mouth daily.    Historical Provider, MD  magnesium oxide (MAG-OX) 400 MG tablet Take 400 mg by mouth daily.    Historical Provider, MD  Multiple Vitamins-Minerals (MULTIVITAMIN WITH MINERALS) tablet Take 1 tablet by mouth daily.    Historical  Provider, MD  mupirocin ointment (BACTROBAN) 2 % Apply topically 3 (three) times daily. 12/24/11   Thao P Le, DO  rivaroxaban (XARELTO) 10 MG TABS tablet Take 15 mg by mouth daily.    Historical Provider, MD  Tamsulosin HCl (FLOMAX) 0.4 MG CAPS Take by mouth.    Historical Provider, MD    Allergies: No Known Allergies  History   Social History  . Marital Status: Single    Spouse Name: N/A    Number of Children: N/A  . Years of Education: N/A   Occupational History  . Not on file.   Social History Main Topics  . Smoking status: Former Smoker -- 2.00 packs/day for 20 years    Types: Cigarettes    Quit date: 11/19/1983  . Smokeless tobacco: Not on file  . Alcohol Use: No  . Drug Use: No  . Sexual Activity: Not on file   Other Topics Concern  . Not on file   Social History Narrative  . No narrative on file     Review of Systems: Constitutional: negative for chills, fever, night sweats, weight changes, or fatigue  HEENT: negative for vision changes, hearing loss, congestion, rhinorrhea, ST, epistaxis, or sinus pressure Cardiovascular: negative for chest pain or palpitations Respiratory: negative for hemoptysis, wheezing, shortness of breath, or cough Abdominal: negative for abdominal pain, nausea, vomiting, diarrhea, or constipation GU: negative hematuria, dysuria, +hematospermia, +penile pain, +groin pain Dermatological: negative for rash Neurologic: negative for headache, dizziness,  or syncope All other systems reviewed and are otherwise negative with the exception to those above and in the HPI.   Physical Exam: Blood pressure 120/76, pulse 64, temperature 97.8 F (36.6 C), resp. rate 16, height 6' 1.5" (1.867 m), weight 191 lb (86.637 kg), SpO2 94.00%., Body mass index is 24.86 kg/(m^2). General: Well developed, well nourished, in no acute distress. Head: Normocephalic, atraumatic, eyes without discharge, sclera non-icteric, nares are without discharge. Bilateral  auditory canals clear, TM's are without perforation, pearly grey and translucent with reflective cone of light bilaterally. Oral cavity moist, posterior pharynx without exudate, erythema, peritonsillar abscess, or post nasal drip.  Neck: Supple. No thyromegaly. Full ROM. No lymphadenopathy. Lungs: Clear bilaterally to auscultation without wheezes, rales, or rhonchi. Breathing is unlabored. Heart: RRR with S1 S2. No murmurs, rubs, or gallops appreciated. Abdomen: Soft, non-tender, non-distended with normoactive bowel sounds. No hepatomegaly. No rebound/guarding. No obvious abdominal masses. Msk:  Strength and tone normal for age. GU: moderately enlarged prostate with no nodularity. Normal genitalia. Extremities/Skin: Warm and dry. No clubbing or cyanosis. No edema. No rashes or suspicious lesions. Neuro: Alert and oriented X 3. Moves all extremities spontaneously. Gait is normal. CNII-XII grossly in tact. Psych:  Responds to questions appropriately with a normal affect.     ASSESSMENT AND PLAN:  68 y.o. year old male with Acute prostatitis - Plan: doxycycline (VIBRA-TABS) 100 MG tablet, PSA   Signed, Elvina SidleKurt Mariacristina Aday, MD 08/27/2013 12:14 PM

## 2013-08-28 LAB — PSA: PSA: 2.6 ng/mL (ref <=4.00)

## 2013-12-15 ENCOUNTER — Telehealth: Payer: Self-pay | Admitting: Family Medicine

## 2013-12-15 NOTE — Telephone Encounter (Signed)
Left a message for patient to return call.

## 2014-01-18 ENCOUNTER — Telehealth: Payer: Self-pay | Admitting: *Deleted

## 2014-01-18 NOTE — Telephone Encounter (Signed)
LMOM for pt to call the office regarding the flu shot.

## 2015-05-10 ENCOUNTER — Ambulatory Visit (INDEPENDENT_AMBULATORY_CARE_PROVIDER_SITE_OTHER): Payer: 59 | Admitting: Family Medicine

## 2015-05-10 VITALS — BP 138/94 | HR 104 | Resp 18 | Ht 73.5 in | Wt 197.8 lb

## 2015-05-10 DIAGNOSIS — J209 Acute bronchitis, unspecified: Secondary | ICD-10-CM

## 2015-05-10 DIAGNOSIS — J069 Acute upper respiratory infection, unspecified: Secondary | ICD-10-CM | POA: Diagnosis not present

## 2015-05-10 MED ORDER — RANITIDINE HCL 150 MG PO TABS: 150.0000 mg | ORAL_TABLET | Freq: Two times a day (BID) | ORAL | Status: DC

## 2015-05-10 MED ORDER — GUAIFENESIN-CODEINE 100-10 MG/5ML PO SOLN: 5.0000 mL | ORAL | Status: DC | PRN

## 2015-05-10 MED ORDER — GUAIFENESIN ER 1200 MG PO TB12: 1.0000 | ORAL_TABLET | Freq: Two times a day (BID) | ORAL | Status: DC | PRN

## 2015-05-10 MED ORDER — IPRATROPIUM BROMIDE 0.03 % NA SOLN: 2.0000 | Freq: Four times a day (QID) | NASAL | Status: DC

## 2015-05-10 MED ORDER — AMOXICILLIN 500 MG PO CAPS: 1000.0000 mg | ORAL_CAPSULE | Freq: Two times a day (BID) | ORAL | Status: DC

## 2015-05-10 NOTE — Progress Notes (Signed)
Subjective:  By signing my name below, I, Stann Oresung-Kai Tsai, attest that this documentation has been prepared under the direction and in the presence of Norberto SorensonEva Shaw, MD. Electronically Signed: Stann Oresung-Kai Tsai, Scribe. 05/10/2015 , 8:48 AM .  Patient was seen in Room 10 .   Patient ID: Ryan Lordshomas G Housand Jr., male    DOB: 1945/09/15, 70 y.o.   MRN: [id number] Chief Complaint  Patient presents with  . Sore Throat    x 1 week  . chest congestion    x 1 week   HPI Ryan Lordshomas G Picking Jr. is a 70 y.o. male who [facility] complaining of sore throat ongoing for a week now. He's also noticed some chest congestion with productive cough (yellow phlegm) that started a few days ago. He also mentions occasional joint ache and low grade headache with this. He's taken alka-seltzer without relief. He denies any known sick contact to strep. He denies shortness of breath or swelling in his legs.   He has a history of afib but denies any recent changes.  He's had tonsillectomy as a child.   Past Medical History  Diagnosis Date  . Atrial fibrillation (HCC)    Prior to Admission medications   Medication Sig Start Date End Date Taking? Authorizing Provider  allopurinol (ZYLOPRIM) 300 MG tablet Take 1 tablet (300 mg total) by mouth 2 (two) times daily. Needs office visit/labs 02/19/12  Yes Heather M Marte, PA-C  dofetilide (TIKOSYN) 250 MCG capsule Take 250 mcg by mouth 2 (two) times daily.   Yes Historical Provider, MD  enalapril (VASOTEC) 5 MG tablet Take 5 mg by mouth daily. Reported on 05/10/2015    Historical Provider, MD  furosemide (LASIX) 20 MG tablet Take 10 mg by mouth daily. Reported on 05/10/2015    Historical Provider, MD  magnesium oxide (MAG-OX) 400 MG tablet Take 400 mg by mouth daily. Reported on 05/10/2015    Historical Provider, MD  Multiple Vitamins-Minerals (MULTIVITAMIN WITH MINERALS) tablet Take 1 tablet by mouth daily.   Yes Historical Provider, MD   No Known Allergies  Review of Systems    Constitutional: Positive for fatigue. Negative for fever and chills.  HENT: Positive for congestion and sore throat.   Respiratory: Positive for cough and chest tightness. Negative for shortness of breath and wheezing.   Cardiovascular: Negative for chest pain, palpitations and leg swelling.  Neurological: Positive for headaches.       Objective:   Physical Exam  Constitutional: He is oriented to person, place, and time. He appears well-developed and well-nourished. No distress.  HENT:  Head: Normocephalic and atraumatic.  Right Ear: Tympanic membrane is erythematous and retracted.  Left Ear: Tympanic membrane is erythematous. A middle ear effusion is present.  Mouth/Throat: Posterior oropharyngeal erythema (with postnasal drip) present.  Nares with erythema bilaterally  Eyes: EOM are normal. Pupils are equal, round, and reactive to light.  Neck: Neck supple.  Cardiovascular: Normal rate, regular rhythm, S1 normal, S2 normal and normal heart sounds.   No murmur heard. Pulmonary/Chest: Effort normal. No respiratory distress. He has rales in the right lower field.  Rales RLF but cleared after deep breathing, otherwise good air movement  Musculoskeletal: Normal range of motion.  Neurological: He is alert and oriented to person, place, and time.  Skin: Skin is warm and dry.  Psychiatric: He has a normal mood and affect. His behavior is normal.  Nursing note and vitals reviewed.   BP 138/94 mmHg  Pulse 104  Resp 18  Ht 6' 1.5" (1.867 m)  Wt 197 lb 12.8 oz (89.721 kg)  BMI 25.74 kg/m2  SpO2 97%    Assessment & Plan:  Can't use fluoroquinolone, macrolide, or trimethoprim due to the tikosyn; so will try amoxicillin.   Reports often having gastritis after taking amoxicillin but willing to try; if feeling well in 4-5d, can complete course after 1 week rather than the full 10d, advised to try PPI with antbiotic but states he's had reaction to PPI's in the past, so agrees to try H2  blocker as well.  1. Acute URI   2. Acute bronchitis, unspecified organism      Meds ordered this encounter  Medications  . amoxicillin (AMOXIL) 500 MG capsule    Sig: Take 2 capsules (1,000 mg total) by mouth 2 (two) times daily.    Dispense:  40 capsule    Refill:  0  . guaiFENesin-codeine 100-10 MG/5ML syrup    Sig: Take 5 mLs by mouth every 4 (four) hours as needed for cough.    Dispense:  180 mL    Refill:  0  . ipratropium (ATROVENT) 0.03 % nasal spray    Sig: Place 2 sprays into the nose 4 (four) times daily.    Dispense:  30 mL    Refill:  1  . Guaifenesin (MUCINEX MAXIMUM STRENGTH) 1200 MG TB12    Sig: Take 1 tablet (1,200 mg total) by mouth every 12 (twelve) hours as needed.    Dispense:  14 tablet    Refill:  1  . ranitidine (ZANTAC) 150 MG tablet    Sig: Take 1 tablet (150 mg total) by mouth 2 (two) times daily.    Dispense:  60 tablet    Refill:  0    I personally performed the services described in this documentation, which was scribed in my presence. The recorded information has been reviewed and considered, and addended by me as needed.  Norberto Sorenson, MD MPH

## 2015-05-10 NOTE — Patient Instructions (Addendum)
   IF you received an x-ray today, you will receive an invoice from [facility]. Please contact [facility] at [phone] with questions or concerns regarding your invoice.   IF you received labwork today, you will receive an invoice from [facility]. Please contact [facility] at [phone] with questions or concerns regarding your invoice.   Our billing staff will not be able to assist you with questions regarding bills from these companies.  You will be contacted with the lab results as soon as they are available. The fastest way to get your results is to activate your My Chart account. Instructions are located on the last page of this paperwork. If you have not heard from us regarding the results in 2 weeks, please contact this office.    Acute Bronchitis Bronchitis is inflammation of the airways that extend from the windpipe into the lungs (bronchi). The inflammation often causes mucus to develop. This leads to a cough, which is the most common symptom of bronchitis.  In acute bronchitis, the condition usually develops suddenly and goes away over time, usually in a couple weeks. Smoking, allergies, and asthma can make bronchitis worse. Repeated episodes of bronchitis may cause further lung problems.  CAUSES Acute bronchitis is most often caused by the same virus that causes a cold. The virus can spread from person to person (contagious) through coughing, sneezing, and touching contaminated objects. SIGNS AND SYMPTOMS   Cough.   Fever.   Coughing up mucus.   Body aches.   Chest congestion.   Chills.   Shortness of breath.   Sore throat.  DIAGNOSIS  Acute bronchitis is usually diagnosed through a physical exam. Your health care provider will also ask you questions about your medical history. Tests, such as chest X-rays, are sometimes done to rule out other conditions.  TREATMENT  Acute bronchitis usually goes away in a  couple weeks. Oftentimes, no medical treatment is necessary. Medicines are sometimes given for relief of fever or cough. Antibiotic medicines are usually not needed but may be prescribed in certain situations. In some cases, an inhaler may be recommended to help reduce shortness of breath and control the cough. A cool mist vaporizer may also be used to help thin bronchial secretions and make it easier to clear the chest.  HOME CARE INSTRUCTIONS  Get plenty of rest.   Drink enough fluids to keep your urine clear or pale yellow (unless you have a medical condition that requires fluid restriction). Increasing fluids may help thin your respiratory secretions (sputum) and reduce chest congestion, and it will prevent dehydration.   Take medicines only as directed by your health care provider.  If you were prescribed an antibiotic medicine, finish it all even if you start to feel better.  Avoid smoking and secondhand smoke. Exposure to cigarette smoke or irritating chemicals will make bronchitis worse. If you are a smoker, consider using nicotine gum or skin patches to help control withdrawal symptoms. Quitting smoking will help your lungs heal faster.   Reduce the chances of another bout of acute bronchitis by washing your hands frequently, avoiding people with cold symptoms, and trying not to touch your hands to your mouth, nose, or eyes.   Keep all follow-up visits as directed by your health care provider.  SEEK MEDICAL CARE IF: Your symptoms do not improve after 1 week of treatment.  SEEK IMMEDIATE MEDICAL CARE IF:  You develop an increased fever or chills.   You have chest pain.     You have severe shortness of breath.  You have bloody sputum.   You develop dehydration.  You faint or repeatedly feel like you are going to pass out.  You develop repeated vomiting.  You develop a severe headache. MAKE SURE YOU:   Understand these instructions.  Will watch your  condition.  Will get help right away if you are not doing well or get worse.   This information is not intended to replace advice given to you by your health care provider. Make sure you discuss any questions you have with your health care provider.   Document Released: 02/02/2004 Document Revised: 01/15/2014 Document Reviewed: 06/17/2012 Elsevier Interactive Patient Education 2016 Elsevier Inc.  

## 2015-05-26 ENCOUNTER — Ambulatory Visit (INDEPENDENT_AMBULATORY_CARE_PROVIDER_SITE_OTHER): Payer: 59 | Admitting: Family Medicine

## 2015-05-26 VITALS — BP 116/72 | HR 66 | Temp 98.3°F | Resp 18 | Ht 73.5 in

## 2015-05-26 DIAGNOSIS — L03115 Cellulitis of right lower limb: Secondary | ICD-10-CM | POA: Diagnosis not present

## 2015-05-26 MED ORDER — DOXYCYCLINE HYCLATE 100 MG PO CAPS: 100.0000 mg | ORAL_CAPSULE | Freq: Two times a day (BID) | ORAL | Status: DC

## 2015-05-26 NOTE — Progress Notes (Signed)
   Subjective:    Patient ID: Ryan Lordshomas G Boehme Jr., male    DOB: 07/30/1945, [age] y.o.   MRN: [id number]  HPI This is a 70 yo male who presents today with right knee pain and swelling. It starts with a small white bump and itches. He used Mupirocin and chlorhexidine. Over the last 24 hours it has gotten very painful and swollen. 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 No fever. [facility]. He has stopped swimming in recent years due to recurrence. Pain currently 1/10, last night was up to 7/10.   He notices redness on one of his knees about every 8 months and starts chlorhexidine wash and mupirocin with resolution. He lives alone.    Past Medical History  Diagnosis Date  . Atrial fibrillation [facility](HCC)    Past Surgical History  Procedure Laterality Date  . Ablation      May 28,2013   Family History  Problem Relation Age of Onset  . Cancer Mother   . Heart disease Father    Social History  Substance Use Topics  . Smoking status: Former Smoker -- 2.00 packs/day for 20 years    Types: Cigarettes    Quit date: 11/19/1983  . Smokeless tobacco: None  . Alcohol Use: No    Review of Systems Per HPI    Objective:   Physical Exam  Constitutional: He is oriented to person, place, and time. He appears well-developed and well-nourished. No distress.  HENT:  Head: Normocephalic and atraumatic.  Cardiovascular: Normal rate.   Pulmonary/Chest: Effort normal.  Musculoskeletal: Normal range of motion.  Neurological: He is alert and oriented to person, place, and time.  Skin: He is not diaphoretic.  Right knee with 4 cm area erythema and edema, and larger area (6 cm) with slight erythema. Central area with small amount clear, yellow drainage. Area warm to touch. No streaking. Normal ROM. Tenderness over erythematous area, none over remainder of knee.   Psychiatric: He has a normal mood and affect. His behavior is normal. Judgment and thought content normal.  Vitals reviewed.     BP  116/72 mmHg  Pulse 66  Temp(Src) 98.3 F (36.8 C) (Oral)  Resp 18  Ht 6' 1.5" (1.867 m)  SpO2 96%     Assessment & Plan:  1. Cellulitis of right knee - Provided written and verbal information regarding diagnosis and treatment. - Discussed recurrent nature and offered him ID referral which he declined at this time - Wound culture- I will call him results and to check on him (best number for the weekend [phone]) - doxycycline (VIBRAMYCIN) 100 MG capsule; Take 1 capsule (100 mg total) by mouth 2 (two) times daily.  Dispense: 20 capsule; Refill: 0 (prescription printed as his pharmacy was not open yet).  - continue chlorhexidine wash, keep covered when in public - warm compresses QID - RTC precautions reviewed  [name], FNP-BC  [facility]  05/26/2015 9:01 AM

## 2015-05-26 NOTE — Patient Instructions (Addendum)
Please return to clinic or go to emergency room if you experience fever over 100, increased pain/swelling once starting antibioitic    IF you received an x-ray today, you will receive an invoice from [facility]. Please contact [facility] at [phone] with questions or concerns regarding your invoice.   IF you received labwork today, you will receive an invoice from [facility]. Please contact [facility] at [phone] with questions or concerns regarding your invoice.   Our billing staff will not be able to assist you with questions regarding bills from these companies.  You will be contacted with the lab results as soon as they are available. The fastest way to get your results is to activate your My Chart account. Instructions are located on the last page of this paperwork. If you have not heard from us regarding the results in 2 weeks, please contact this office.    MRSA Infection, Adult MRSA stands for methicillin-resistant Staphylococcus aureus. This type of infection is caused by Staphylococcus aureus bacteria that are no longer affected by the medicines used to kill them (drug resistant). Staphylococcus (staph) bacteria are normally found on the skin or in the nose of healthy people. In most cases, these bacteria do not cause infection. But if these resistant bacteria enter your body through a cut or sore, they can cause a serious infection on your skin or in other parts of your body. There is a slight chance that the staph on your skin or in your nose is MRSA. There are two types of MRSA infections:  Hospital-acquired MRSA is bacteria that you get in the hospital.  Community-acquired MRSA is bacteria that you get somewhere other than in a hospital. RISK FACTORS Hospital-acquired MRSA is more common. You could be at risk for this infection if you are in the hospital and you:  Have surgery or a procedure.  Have an IV access or a catheter  tube placed in your body.  Have weak resistance to germs (weakened immune system).  Are elderly.  Are on kidney dialysis. You could be at risk for community-acquired MRSA if you have a break in your skin and come into contact with MRSA. This may happen if you:  Play sports where there is skin-to-skin contact.  Live in a crowded setting, like a dormitory or a [employer].  Share towels, razors, or sports equipment with other people. SYMPTOMS  Symptoms of hospital-acquired MRSA depend on where MRSA has spread. Symptoms may include:  Wound infection.  Skin infection.  Rash.  Pneumonia.  Fever and chills.  Difficulty breathing.  Chest pain. Community-acquired MRSA is most likely to start as a scratch or cut that becomes infected. Symptoms may include:  A pus-filled pimple.  A boil on your skin.  Pus draining from your skin.  A sore (abscess) under your skin or somewhere in your body.  Fever with or without chills. DIAGNOSIS  The diagnosis of MRSA is made by taking a sample from an infected area and sending it to a lab for testing. A lab technician can grow (culture) MRSA and check it under a microscope. The cultured MRSA can be tested to see which type of antibiotic medicine will work to treat it. Newer tests can identify MRSA more quickly by testing bacteria samples for MRSA genes. Your health care provider can diagnose MRSA using samples from:   Cuts or wounds in infected areas.  Nasal swabs.  Saliva or cough specimens from deep in the lungs (sputum).  Urine.  Blood. You may also have:  Imaging studies (such as X-ray or MRI) to check if the infection has spread to the lungs, bones, or joints.  A culture and sensitivity test of blood or fluids from inside the joints. TREATMENT  Treatment depends on how severe, deep, or extensive the infection is. Very bad infections may require a hospital stay.  Some skin infections, such as a small boil or sore  (abscess), may be treated by draining pus from the site of the infection.  More extensive surgery to drain pus may be necessary for deeper or more widespread soft tissue infections.  You may then have to take antibiotic medicine given by mouth or through a vein. You may start antibiotic treatment right away or after testing can be done to see what antibiotic medicine should be used. HOME CARE INSTRUCTIONS   Take your antibiotics as directed by your health care provider. Take the medicine as prescribed until it is finished.  Avoid close contact with those around you as much as possible. Do not use towels, razors, toothbrushes, bedding, or other items that will be used by others.  Wash your hands frequently for 15 seconds with soap and water. Dry your hands with a clean or disposable towel.  When you are not able to wash your hands, use hand sanitizer that is more than 60 percent alcohol.  Wash towels, sheets, or clothes in the washing machine with detergent and hot water. Dry them in a hot dryer.  Follow your health care provider's instructions for wound care. Wash your hands before and after changing your bandages.  Always shower after exercising.  Keep all cuts and scrapes clean and covered with a bandage.  Be sure to tell all your health care providers that you have MRSA so they are aware of your infection. SEEK MEDICAL CARE IF:  You have a cut, scrape, pimple, or boil that becomes red, swollen, or painful or has pus in it.  You have pus draining from your skin.  You have an abscess under your skin or somewhere in your body. SEEK IMMEDIATE MEDICAL CARE IF:   You have symptoms of a skin infection with a fever or chills.  You have trouble breathing.  You have chest pain.  You have a skin wound and you become nauseous or start vomiting. MAKE SURE YOU:  Understand these instructions.  Will watch your condition.  Will get help right away if you are not doing well or get  worse.   This information is not intended to replace advice given to you by your health care provider. Make sure you discuss any questions you have with your health care provider.   Document Released: 12/25/2004 Document Revised: 05/11/2014 Document Reviewed: 10/17/2012 Elsevier Interactive Patient Education [employer]

## 2015-05-29 LAB — WOUND CULTURE
GRAM STAIN: NONE SEEN
GRAM STAIN: NONE SEEN
Gram Stain: NONE SEEN

## 2015-12-15 ENCOUNTER — Ambulatory Visit (INDEPENDENT_AMBULATORY_CARE_PROVIDER_SITE_OTHER): Payer: Medicare HMO | Admitting: Physician Assistant

## 2015-12-15 VITALS — BP 132/74 | HR 55 | Temp 97.7°F | Resp 16 | Ht 72.0 in | Wt 196.2 lb

## 2015-12-15 DIAGNOSIS — Z8739 Personal history of other diseases of the musculoskeletal system and connective tissue: Secondary | ICD-10-CM | POA: Diagnosis not present

## 2015-12-15 DIAGNOSIS — Z79899 Other long term (current) drug therapy: Secondary | ICD-10-CM | POA: Diagnosis not present

## 2015-12-15 MED ORDER — ALLOPURINOL 300 MG PO TABS: 300.0000 mg | ORAL_TABLET | Freq: Every day | ORAL | 4 refills | Status: AC

## 2015-12-15 NOTE — Progress Notes (Signed)
   Ryan Navarro.  MRN: [id number] DOB: [birth year]  PCP: Robyn Haber, MD  Subjective:  Pt is a 70 year old male, history of gout, who presents to clinic for medication refill of Allopurinol.   History of gout - His gout attacks always involve his right big toe. His toe would swell to about three times the size of his normal toe. Describes it as so debilitating that he could not walk.  Started Allopurinol about 20 years ago. Reports no side effects. Gout is well controlled with this medications. He has had no flare ups since he started on this medication. He is completely out, took his last dose this morning.  He currently does not have a PCP. His cardiologist prescribed this medication for him, however he retired.   Review of Systems  Constitutional: Negative for chills, diaphoresis and fever.  Respiratory: Negative for cough, chest tightness, shortness of breath and wheezing.   Cardiovascular: Negative for chest pain, palpitations and leg swelling.  Gastrointestinal: Negative for diarrhea, nausea and vomiting.  Skin: Negative for color change, pallor and wound.  Neurological: Negative for dizziness, syncope, light-headedness and headaches.  Psychiatric/Behavioral: Negative for sleep disturbance. The patient is not nervous/anxious.     Patient Active Problem List   Diagnosis Date Noted  . Atrial fibrillation ([place]) 09/21/2011    Current Outpatient Prescriptions on File Prior to Visit  Medication Sig Dispense Refill  . allopurinol (ZYLOPRIM) 300 MG tablet Take 1 tablet (300 mg total) by mouth 2 (two) times daily. Needs office visit/labs 60 tablet 0  . dofetilide (TIKOSYN) 250 MCG capsule Take 250 mcg by mouth 2 (two) times daily.    . Multiple Vitamins-Minerals (MULTIVITAMIN WITH MINERALS) tablet Take 1 tablet by mouth daily.    . enalapril (VASOTEC) 5 MG tablet Take 5 mg by mouth daily. Reported on 05/10/2015    . furosemide (LASIX) 20 MG tablet Take 10 mg by mouth daily.  Reported on 05/10/2015    . magnesium oxide (MAG-OX) 400 MG tablet Take 400 mg by mouth daily. Reported on 05/10/2015     No current facility-administered medications on file prior to visit.     No Known Allergies   Objective:  BP 132/74 (BP Location: Right Arm, Patient Position: Sitting, Cuff Size: Large)   Pulse (!) 55   Temp 97.7 F (36.5 C) (Oral)   Resp 16   Ht 6' (1.829 m)   Wt 196 lb 3.2 oz (89 kg)   SpO2 96%   BMI 26.61 kg/m   Physical Exam  Constitutional: He is oriented to person, place, and time and well-developed, well-nourished, and in no distress. No distress.  Cardiovascular: Normal rate, regular rhythm and normal heart sounds.   Pulmonary/Chest: Effort normal. No respiratory distress.  Neurological: He is alert and oriented to person, place, and time. GCS score is 15.  Skin: Skin is warm and dry.  Psychiatric: Mood, memory, affect and judgment normal.  Vitals reviewed.   Assessment and Plan :  1. History of gout 2. Encounter for medication management - allopurinol (ZYLOPRIM) 300 MG tablet; Take 1 tablet (300 mg total) by mouth daily. Needs office visit/labs  Dispense: 90 tablet; Refill: 4 - CMP14+EGFR - Encouraged patient to make appointment for annual exam.   Mercer Pod, PA-C  [facility] 12/15/2015 4:16 PM

## 2015-12-16 LAB — CMP14+EGFR
ALT: 16 IU/L (ref 0–44)
AST: 20 IU/L (ref 0–40)
Albumin/Globulin Ratio: 1.8 (ref 1.2–2.2)
Albumin: 4.4 g/dL (ref 3.5–4.8)
Alkaline Phosphatase: 70 IU/L (ref 39–117)
BUN/Creatinine Ratio: 23 (ref 10–24)
BUN: 22 mg/dL (ref 8–27)
Bilirubin Total: 0.2 mg/dL (ref 0.0–1.2)
CO2: 25 mmol/L (ref 18–29)
Calcium: 9.9 mg/dL (ref 8.6–10.2)
Chloride: 104 mmol/L (ref 96–106)
Creatinine, Ser: 0.96 mg/dL (ref 0.76–1.27)
GFR calc Af Amer: 92 mL/min/{1.73_m2} (ref >59)
GFR calc non Af Amer: 80 mL/min/{1.73_m2} (ref >59)
Globulin, Total: 2.5 g/dL (ref 1.5–4.5)
Glucose: 81 mg/dL (ref 65–99)
Potassium: 4.3 mmol/L (ref 3.5–5.2)
Sodium: 146 mmol/L — ABNORMAL HIGH (ref 134–144)
Total Protein: 6.9 g/dL (ref 6.0–8.5)

## 2015-12-31 ENCOUNTER — Encounter: Payer: Self-pay | Admitting: Physician Assistant

## 2016-01-03 ENCOUNTER — Ambulatory Visit (INDEPENDENT_AMBULATORY_CARE_PROVIDER_SITE_OTHER): Payer: Medicare HMO | Admitting: Urgent Care

## 2016-01-03 VITALS — BP 118/74 | HR 60 | Temp 98.0°F | Resp 16 | Ht 72.0 in | Wt 196.8 lb

## 2016-01-03 DIAGNOSIS — L02416 Cutaneous abscess of left lower limb: Secondary | ICD-10-CM

## 2016-01-03 MED ORDER — DOXYCYCLINE HYCLATE 100 MG PO CAPS: 100.0000 mg | ORAL_CAPSULE | Freq: Two times a day (BID) | ORAL | 0 refills | Status: DC

## 2016-01-03 NOTE — Patient Instructions (Addendum)
Skin Abscess A skin abscess is an infected area on or under your skin that contains a collection of pus and other material. An abscess may also be called a furuncle, carbuncle, or boil. An abscess can occur in or on almost any part of your body. Some abscesses break open (rupture) on their own. Most continue to get worse unless they are treated. The infection can spread deeper into the body and eventually into your blood, which can make you feel ill. Treatment usually involves draining the abscess. What are the causes? An abscess occurs when germs, often bacteria, pass through your skin and cause an infection. This may be caused by:  A scrape or cut on your skin.  A puncture wound through your skin, including a needle injection.  Blocked oil or sweat glands.  Blocked and infected hair follicles.  A cyst that forms beneath your skin (sebaceous cyst) and becomes infected. What increases the risk? This condition is more likely to develop in people who:  Have a weak body defense system (immune system).  Have diabetes.  Have dry and irritated skin.  Get frequent injections or use illegal IV drugs.  Have a foreign body in a wound, such as a splinter.  Have problems with their lymph system or veins. What are the signs or symptoms? An abscess may start as a painful, firm bump under the skin. Over time, the abscess may get larger or become softer. Pus may appear at the top of the abscess, causing pressure and pain. It may eventually break through the skin and drain. Other symptoms include:  Redness.  Warmth.  Swelling.  Tenderness.  A sore on the skin. How is this diagnosed? This condition is diagnosed based on your medical history and a physical exam. A sample of pus may be taken from the abscess to find out what is causing the infection and what antibiotics can be used to treat it. You also may have:  Blood tests to look for signs of infection or spread of an infection to your  blood.  Imaging studies such as ultrasound, CT scan, or MRI if the abscess is deep. How is this treated? Small abscesses that drain on their own may not need treatment. Treatment for an abscess that does not rupture on its own may include:  Warm compresses applied to the area several times per day.  Incision and drainage. Your health care provider will make an incision to open the abscess and will remove pus and any foreign body or dead tissue. The incision area may be packed with gauze to keep it open for a few days while it heals.  Antibiotic medicines to treat infection. For a severe abscess, you may first get antibiotics through an IV and then change to oral antibiotics. Follow these instructions at home: Abscess Care  If you have an abscess that has not drained, place a warm, clean, wet washcloth over the abscess several times a day. Do this as told by your health care provider.  Follow instructions from your health care provider about how to take care of your abscess. Make sure you:  Cover the abscess with a bandage (dressing).  Change your dressing or gauze as told by your health care provider.  Wash your hands with soap and water before you change the dressing or gauze. If soap and water are not available, use hand sanitizer.  Check your abscess every day for signs of a worsening infection. Check for:  More redness, swelling, or pain.  More fluid or blood.  Warmth.  More pus or a bad smell. Medicines  Take over-the-counter and prescription medicines only as told by your health care provider.  If you were prescribed an antibiotic medicine, take it as told by your health care provider. Do not stop taking the antibiotic even if you start to feel better. General instructions  To avoid spreading the infection:  Do not share personal care items, towels, or hot tubs with others.  Avoid making skin contact with other people.  Keep all follow-up visits as told by your  health care provider. This is important. Contact a health care provider if:  You have more redness, swelling, or pain around your abscess.  You have more fluid or blood coming from your abscess.  Your abscess feels warm to the touch.  You have more pus or a bad smell coming from your abscess.  You have a fever.  You have muscle aches.  You have chills or a general ill feeling. Get help right away if:  You have severe pain.  You see red streaks on your skin spreading away from the abscess. This information is not intended to replace advice given to you by your health care provider. Make sure you discuss any questions you have with your health care provider. Document Released: 10/04/2004 Document Revised: 08/21/2015 Document Reviewed: 11/03/2014 Elsevier Interactive Patient Education  2017 [employer]     IF you received an x-ray today, you will receive an invoice from [facility]. Please contact [facility] at [phone] with questions or concerns regarding your invoice.   IF you received labwork today, you will receive an invoice from [facility]. Please contact [facility] at [phone] with questions or concerns regarding your invoice.   Our billing staff will not be able to assist you with questions regarding bills from these companies.  You will be contacted with the lab results as soon as they are available. The fastest way to get your results is to activate your My Chart account. Instructions are located on the last page of this paperwork. If you have not heard from us regarding the results in 2 weeks, please contact this office.

## 2016-01-03 NOTE — Progress Notes (Signed)
    MRN: [id number] DOB: 07-Jul-1945  Subjective:   Ryan Lordshomas G Venables Jr. is a 70 y.o. 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 Reports that he has had redness, worsening pain, swelling over an area of his front thigh. He has been using his girlfriend's mupirocin ointment with minmal relief. Denies fever, drainage of pus or bleeding. Of note, patient last had a recurrent skin infection in 05/2015. He was seen by NP Gavin Poundeborah. She offered ID referral which he declined. She started him on doxycycline to address cellulitis.   [name] has a current medication list which includes the following prescription(s): allopurinol, dofetilide, multivitamin with minerals, enalapril, furosemide, and magnesium oxide. Also has No Known Allergies.  [name]  has a past medical history of Atrial fibrillation (HCC). Also  has a past surgical history that includes Ablation.  Objective:   Vitals: BP 118/74   Pulse 60   Temp 98 F (36.7 C) (Oral)   Resp 16   Ht 6' (1.829 m)   Wt 196 lb 12.8 oz (89.3 kg)   SpO2 97%   BMI 26.69 kg/m   Physical Exam  Constitutional: He is oriented to person, place, and time. He appears well-developed and well-nourished.  Cardiovascular: Normal rate.   Pulmonary/Chest: Effort normal.  Neurological: He is alert and oriented to person, place, and time.  Skin:      Assessment and Plan :   1. Abscess of left thigh - Start doxycycline, patient refused I&D today. I discussed risks of worsening infection. He verbalized understanding.  Wallis BambergMario Reade Trefz, PA-C [facility] [phone] 01/03/2016 11:04 AM

## 2017-05-08 ENCOUNTER — Other Ambulatory Visit: Payer: Self-pay | Admitting: Physician Assistant

## 2017-05-08 DIAGNOSIS — Z8739 Personal history of other diseases of the musculoskeletal system and connective tissue: Secondary | ICD-10-CM

## 2017-08-07 MED ORDER — DOCUSATE SODIUM 100 MG PO CAPS
100.00 | ORAL_CAPSULE | ORAL | Status: DC
Start: ? — End: 2017-08-07

## 2017-08-07 MED ORDER — OXYCODONE HCL 5 MG PO TABS
5.00 | ORAL_TABLET | ORAL | Status: DC
Start: ? — End: 2017-08-07

## 2017-08-07 MED ORDER — METOPROLOL SUCCINATE ER 25 MG PO TB24
25.00 | ORAL_TABLET | ORAL | Status: DC
Start: 2017-08-08 — End: 2017-08-07

## 2017-08-07 MED ORDER — ACETAMINOPHEN 325 MG PO TABS
650.00 | ORAL_TABLET | ORAL | Status: DC
Start: ? — End: 2017-08-07

## 2017-08-07 MED ORDER — PANTOPRAZOLE SODIUM 40 MG PO TBEC
40.00 | DELAYED_RELEASE_TABLET | ORAL | Status: DC
Start: 2017-08-07 — End: 2017-08-07

## 2017-08-07 MED ORDER — COLCHICINE 0.6 MG PO TABS
.60 | ORAL_TABLET | ORAL | Status: DC
Start: 2017-08-07 — End: 2017-08-07

## 2017-08-07 MED ORDER — APIXABAN 5 MG PO TABS
5.00 | ORAL_TABLET | ORAL | Status: DC
Start: 2017-08-07 — End: 2017-08-07

## 2017-08-07 MED ORDER — DOFETILIDE 250 MCG PO CAPS
250.00 | ORAL_CAPSULE | ORAL | Status: DC
Start: 2017-08-07 — End: 2017-08-07

## 2017-08-07 MED ORDER — ALLOPURINOL 300 MG PO TABS
300.00 | ORAL_TABLET | ORAL | Status: DC
Start: 2017-08-08 — End: 2017-08-07

## 2017-08-07 MED ORDER — MAGNESIUM OXIDE 400 MG PO TABS
400.00 | ORAL_TABLET | ORAL | Status: DC
Start: 2017-08-08 — End: 2017-08-07

## 2017-08-07 MED ORDER — MORPHINE SULFATE 4 MG/ML IJ SOLN
2.00 | INTRAMUSCULAR | Status: DC
Start: ? — End: 2017-08-07

## 2017-08-07 MED ORDER — MAGNESIUM HYDROXIDE 400 MG/5ML PO SUSP
30.00 | ORAL | Status: DC
Start: ? — End: 2017-08-07

## 2017-08-07 MED ORDER — SUCRALFATE 1 GM/10ML PO SUSP
1.00 | ORAL | Status: DC
Start: 2017-08-07 — End: 2017-08-07

## 2017-08-07 MED ORDER — ALUM & MAG HYDROXIDE-SIMETH 400-400-40 MG/5ML PO SUSP
15.00 | ORAL | Status: DC
Start: ? — End: 2017-08-07

## 2017-11-12 ENCOUNTER — Encounter (HOSPITAL_COMMUNITY): Payer: Self-pay

## 2017-11-12 ENCOUNTER — Other Ambulatory Visit: Payer: Self-pay

## 2017-11-12 ENCOUNTER — Ambulatory Visit: Payer: Medicare HMO | Admitting: Physician Assistant

## 2017-11-12 ENCOUNTER — Ambulatory Visit (HOSPITAL_COMMUNITY)
Admission: EM | Admit: 2017-11-12 | Discharge: 2017-11-12 | Disposition: A | Discharge status: Home / Self Care | Payer: Medicare HMO | Attending: Family Medicine | Admitting: Family Medicine

## 2017-11-12 DIAGNOSIS — T83511A Infection and inflammatory reaction due to indwelling urethral catheter, initial encounter: Secondary | ICD-10-CM

## 2017-11-12 DIAGNOSIS — I4891 Unspecified atrial fibrillation: Secondary | ICD-10-CM | POA: Insufficient documentation

## 2017-11-12 DIAGNOSIS — Z79899 Other long term (current) drug therapy: Secondary | ICD-10-CM | POA: Insufficient documentation

## 2017-11-12 DIAGNOSIS — Z8249 Family history of ischemic heart disease and other diseases of the circulatory system: Secondary | ICD-10-CM | POA: Diagnosis not present

## 2017-11-12 DIAGNOSIS — M109 Gout, unspecified: Secondary | ICD-10-CM | POA: Diagnosis not present

## 2017-11-12 DIAGNOSIS — Z9889 Other specified postprocedural states: Secondary | ICD-10-CM | POA: Diagnosis not present

## 2017-11-12 DIAGNOSIS — I10 Essential (primary) hypertension: Secondary | ICD-10-CM | POA: Insufficient documentation

## 2017-11-12 DIAGNOSIS — Z87891 Personal history of nicotine dependence: Secondary | ICD-10-CM | POA: Diagnosis not present

## 2017-11-12 DIAGNOSIS — N390 Urinary tract infection, site not specified: Secondary | ICD-10-CM | POA: Insufficient documentation

## 2017-11-12 DIAGNOSIS — L02416 Cutaneous abscess of left lower limb: Secondary | ICD-10-CM

## 2017-11-12 MED ORDER — DOXYCYCLINE HYCLATE 100 MG PO CAPS: 100.0000 mg | ORAL_CAPSULE | Freq: Two times a day (BID) | ORAL | 2 refills | Status: DC

## 2017-11-12 NOTE — ED Triage Notes (Signed)
Pt cc UTI. This is a on going issue for him. (Itching and burning ) x 1 week or more.

## 2017-11-12 NOTE — ED Notes (Signed)
Urinalysis testing results exceeded the limitations of the clinitek capabilities. Reported values to the provider

## 2017-11-12 NOTE — ED Provider Notes (Addendum)
[facility]    CSN: [id number] Arrival date & time: 11/12/17  4098     History   Chief Complaint Chief Complaint  Patient presents with  . Urinary Tract Infection    HPI TARRENCE ENCK is a 72 y.o. male.   Established patient.  72 year old man who thinks he may have a urinary infection.  He underwent a surgical procedure (ablation) in the past and was catheterized.  He has had intermittent urinary tract infection since.  He now has 4 to 5 days of dysuria and frequency.  Past medical history significant for atrial fibrillation, hypertension, and gout.  Former smoker and has no allergies.  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  He lives in [place] neighborhood      Past Medical History:  Diagnosis Date  . Atrial fibrillation [facility])     Patient Active Problem List   Diagnosis Date Noted  . Atrial fibrillation (HCC) 09/21/2011    Past Surgical History:  Procedure Laterality Date  . ABLATION     May 28,2013       Home Medications    Prior to Admission medications   Medication Sig Start Date End Date Taking? Authorizing Provider  allopurinol (ZYLOPRIM) 300 MG tablet Take 1 tablet (300 mg total) by mouth daily. Needs office visit/labs 12/15/15   McVey, Madelaine Bhat, PA-C  doxycycline (VIBRAMYCIN) 100 MG capsule Take 1 capsule (100 mg total) by mouth 2 (two) times daily. 11/12/17   Elvina Sidle, MD  enalapril (VASOTEC) 5 MG tablet Take 5 mg by mouth daily. Reported on 05/10/2015    [provider]  magnesium oxide (MAG-OX) 400 MG tablet Take 400 mg by mouth daily. Reported on 05/10/2015    [provider]  metoprolol succinate (TOPROL-XL) 25 MG 24 hr tablet  11/05/17   [provider]  Multiple Vitamins-Minerals (MULTIVITAMIN WITH MINERALS) tablet Take 1 tablet by mouth daily.    [provider]    Family History Family History  Problem Relation Age of Onset  . Cancer Mother   . Heart disease Father      Social History Social History   Tobacco Use  . Smoking status: Former Smoker    Packs/day: 2.00    Years: 20.00    Pack years: 40.00    Types: Cigarettes    Last attempt to quit: 11/19/1983    Years since quitting: 34.0  . Smokeless tobacco: Never Used  Substance Use Topics  . Alcohol use: No  . Drug use: No     Allergies   Patient has no known allergies.   Review of Systems Review of Systems   Physical Exam Triage Vital Signs ED Triage Vitals  Enc Vitals Group     BP 11/12/17 0947 128/87     Pulse Rate 11/12/17 0947 86     Resp 11/12/17 0947 18     Temp 11/12/17 0947 97.7 F (36.5 C)     Temp Source 11/12/17 0947 Oral     SpO2 11/12/17 0947 99 %     Weight 11/12/17 0948 182 lb (82.6 kg)     Height --      Head Circumference --      Peak Flow --      Pain Score 11/12/17 0948 2     Pain Loc --      Pain Edu? --      Excl. in GC? --    No data found.  Updated Vital Signs BP 128/87 (  BP Location: Right Arm)   Pulse 86   Temp 97.7 F (36.5 C) (Oral)   Resp 18   Wt 82.6 kg   SpO2 99%   BMI 24.68 kg/m    Physical Exam  Constitutional: He is oriented to person, place, and time. He appears well-developed.  Eyes: Conjunctivae are normal.  Neck: Normal range of motion. Neck supple.  Pulmonary/Chest: Effort normal.  Musculoskeletal: Normal range of motion.  Neurological: He is alert and oriented to person, place, and time.  Skin: Skin is warm and dry.  Psychiatric: He has a normal mood and affect.  Nursing note and vitals reviewed.    UC Treatments / Results  Labs (all labs ordered are listed, but only abnormal results are displayed) Labs Reviewed  URINE CULTURE    EKG None  Radiology No results found.  Procedures Procedures (including critical care time)  Medications Ordered in UC Medications - No data to display  Initial Impression / Assessment and Plan / UC Course  I have reviewed the triage vital signs and the nursing  notes.  Pertinent labs & imaging results that were available during my care of the patient were reviewed by me and considered in my medical decision making (see chart for details).    Final Clinical Impressions(s) / UC Diagnoses   Final diagnoses:  Urinary tract infection associated with catheterization of urinary tract, unspecified indwelling urinary catheter type, initial encounter [facility])   Discharge Instructions   None    ED Prescriptions    Medication Sig Dispense Auth. Provider   doxycycline (VIBRAMYCIN) 100 MG capsule  (Status: Discontinued) Take 1 capsule (100 mg total) by mouth 2 (two) times daily. 60 capsule Elvina Sidle, MD   doxycycline (VIBRAMYCIN) 100 MG capsule Take 1 capsule (100 mg total) by mouth 2 (two) times daily. 60 capsule Elvina Sidle, MD     Controlled Substance Prescriptions [place] Controlled Substance Registry consulted? Not Applicable   Elvina Sidle, MD 11/12/17 1009    Elvina Sidle, MD 11/12/17 1028

## 2017-11-14 LAB — URINE CULTURE: Culture: >=100000 — AB

## 2017-11-18 ENCOUNTER — Telehealth (HOSPITAL_COMMUNITY): Payer: Self-pay | Admitting: Emergency Medicine

## 2017-11-18 MED ORDER — SULFAMETHOXAZOLE-TRIMETHOPRIM 800-160 MG PO TABS: 1.0000 | ORAL_TABLET | Freq: Two times a day (BID) | ORAL | 0 refills | Status: AC

## 2017-11-18 NOTE — Telephone Encounter (Signed)
Urine culture was positive for ENTEROBACTER AEROGENES, most likely resistant to doxycycoline given at urgent care visit. Prescription for septra per Dr. Delton See sent to pharmacy of choice. Attempted to reach patient. No answer at this time. Voicemail left.

## 2017-11-19 ENCOUNTER — Telehealth (HOSPITAL_COMMUNITY): Payer: Self-pay

## 2017-11-20 ENCOUNTER — Telehealth (HOSPITAL_COMMUNITY): Payer: Self-pay | Admitting: Emergency Medicine

## 2017-11-20 NOTE — Telephone Encounter (Signed)
Attempted to reach patient x2. No answer at this time. Voicemail left.    

## 2017-12-24 ENCOUNTER — Encounter (HOSPITAL_COMMUNITY): Payer: Self-pay

## 2017-12-24 ENCOUNTER — Ambulatory Visit (HOSPITAL_COMMUNITY)
Admission: EM | Admit: 2017-12-24 | Discharge: 2017-12-24 | Disposition: A | Discharge status: Home / Self Care | Payer: Medicare HMO | Attending: Internal Medicine | Admitting: Internal Medicine

## 2017-12-24 DIAGNOSIS — R300 Dysuria: Secondary | ICD-10-CM | POA: Diagnosis not present

## 2017-12-24 LAB — POCT URINALYSIS DIP (DEVICE)
Bilirubin Urine: NEGATIVE
Glucose, UA: NEGATIVE mg/dL
Hgb urine dipstick: NEGATIVE
KETONES UR: NEGATIVE mg/dL
Leukocytes, UA: NEGATIVE
Nitrite: NEGATIVE
Protein, ur: NEGATIVE mg/dL
Specific Gravity, Urine: 1.02 (ref 1.005–1.030)
Urobilinogen, UA: 0.2 mg/dL (ref 0.0–1.0)
pH: 7 (ref 5.0–8.0)

## 2017-12-24 NOTE — Discharge Instructions (Addendum)
Your urine did not show any signs of infection today If your symptoms continue please follow-up with your urologist

## 2017-12-24 NOTE — ED Triage Notes (Signed)
Pt Complain of UTI that has been going on for over month.  Pt was treated on 11/12/17 and completed medication but symptoms return.

## 2017-12-24 NOTE — ED Provider Notes (Signed)
[facility]    CSN: [id number] Arrival date & time: 12/24/17  [id number]     History   Chief Complaint Chief Complaint  Patient presents with  . Urinary Frequency    HPI Ryan Navarro is a 72 y.o. male.   Patient is a 72 year old male with past medical history of A. fib.  He presents with continued dysuria.  He was seen here on 11/12/2017 and treated for a urinary tract infection.  The initial treatment was resistant so antibiotic was switched to Bactrim which he believes helped with most of his symptoms.  He still continues to have some dysuria mostly in the morning time with the first void.  Throughout the day the symptoms improved.  He has been drinking plenty of water.  He reports some urinary frequency.  He denies any associated abdominal pain, back pain, fevers, chills, nausea.  He denies any leakage or dribbling.  Denies any testicle pain or swelling.   ROS per HPI      Past Medical History:  Diagnosis Date  . Atrial fibrillation [facility](HCC)     Patient Active Problem List   Diagnosis Date Noted  . Atrial fibrillation (HCC) 09/21/2011    Past Surgical History:  Procedure Laterality Date  . ABLATION     May 28,2013       Home Medications    Prior to Admission medications   Medication Sig Start Date End Date Taking? Authorizing Provider  allopurinol (ZYLOPRIM) 300 MG tablet Take 1 tablet (300 mg total) by mouth daily. Needs office visit/labs 12/15/15   McVey, Madelaine BhatElizabeth Whitney, PA-C  doxycycline (VIBRAMYCIN) 100 MG capsule Take 1 capsule (100 mg total) by mouth 2 (two) times daily. 11/12/17   Elvina SidleLauenstein, Kurt, MD  enalapril (VASOTEC) 5 MG tablet Take 5 mg by mouth daily. Reported on 05/10/2015    [provider]  magnesium oxide (MAG-OX) 400 MG tablet Take 400 mg by mouth daily. Reported on 05/10/2015    [provider]  metoprolol succinate (TOPROL-XL) 25 MG 24 hr tablet  11/05/17   [provider]  Multiple Vitamins-Minerals  (MULTIVITAMIN WITH MINERALS) tablet Take 1 tablet by mouth daily.    [provider]    Family History Family History  Problem Relation Age of Onset  . Cancer Mother   . Heart disease Father     Social History Social History   Tobacco Use  . Smoking status: Former Smoker    Packs/day: 2.00    Years: 20.00    Pack years: 40.00    Types: Cigarettes    Last attempt to quit: 11/19/1983    Years since quitting: 34.1  . Smokeless tobacco: Never Used  Substance Use Topics  . Alcohol use: No  . Drug use: No     Allergies   Patient has no known allergies.   Review of Systems Review of Systems   Physical Exam Triage Vital Signs ED Triage Vitals  Enc Vitals Group     BP 12/24/17 0914 128/62     Pulse Rate 12/24/17 0914 (!) 57     Resp 12/24/17 0914 16     Temp 12/24/17 0914 97.9 F (36.6 C)     Temp Source 12/24/17 0914 Oral     SpO2 12/24/17 0914 97 %     Weight --      Height --      Head Circumference --      Peak Flow --      Pain Score  12/24/17 0916 2     Pain Loc --      Pain Edu? --      Excl. in GC? --    No data found.  Updated Vital Signs BP 128/62 (BP Location: Left Arm)   Pulse (!) 57   Temp 97.9 F (36.6 C) (Oral)   Resp 16   SpO2 97%   Visual Acuity Right Eye Distance:   Left Eye Distance:   Bilateral Distance:    Right Eye Near:   Left Eye Near:    Bilateral Near:     Physical Exam Vitals signs and nursing note reviewed.  Constitutional:      Appearance: Normal appearance.  HENT:     Head: Normocephalic and atraumatic.     Nose: Nose normal.  Eyes:     Conjunctiva/sclera: Conjunctivae normal.  Neck:     Musculoskeletal: Normal range of motion.  Pulmonary:     Effort: Pulmonary effort is normal.  Abdominal:     Palpations: Abdomen is soft.     Tenderness: There is no abdominal tenderness.  Musculoskeletal: Normal range of motion.  Skin:    General: Skin is warm and Navarro.  Neurological:     Mental Status: He is  alert.  Psychiatric:        Mood and Affect: Mood normal.      UC Treatments / Results  Labs (all labs ordered are listed, but only abnormal results are displayed) Labs Reviewed  POCT URINALYSIS DIP (DEVICE)    EKG None  Radiology No results found.  Procedures Procedures (including critical care time)  Medications Ordered in UC Medications - No data to display  Initial Impression / Assessment and Plan / UC Course  I have reviewed the triage vital signs and the nursing notes.  Pertinent labs & imaging results that were available during my care of the patient were reviewed by me and considered in my medical decision making (see chart for details).     Urine was negative for infection Likely dysuria is residual from the infection and irritation. Instructed that if his symptoms continue to please follow with his urologist. Final Clinical Impressions(s) / UC Diagnoses   Final diagnoses:  Dysuria     Discharge Instructions     Your urine did not show any signs of infection today If your symptoms continue please follow-up with your urologist    ED Prescriptions    None     Controlled Substance Prescriptions [place] Controlled Substance Registry consulted? Not Applicable   Janace Aris, NP 12/24/17 [phone]

## 2019-02-17 ENCOUNTER — Ambulatory Visit: Payer: Medicare HMO | Attending: Internal Medicine

## 2019-02-17 DIAGNOSIS — Z23 Encounter for immunization: Secondary | ICD-10-CM

## 2019-02-17 NOTE — Progress Notes (Signed)
   Covid-19 Vaccination Clinic  Name:  Ryan Navarro    MRN: [id number] DOB: [birth year]  02/17/2019  Mr. Ryan Navarro was observed post Covid-19 immunization for 15 minutes without incidence. He was provided with Vaccine Information Sheet and instruction to access the V-Safe system.   Mr. Ryan Navarro was instructed to call 911 with any severe reactions post vaccine: Marland Kitchen Difficulty breathing  . Swelling of your face and throat  . A fast heartbeat  . A bad rash all over your body  . Dizziness and weakness    Immunizations Administered    Name Date Dose VIS Date Route   Pfizer COVID-19 Vaccine 02/17/2019  2:00 PM 0.3 mL 12/19/2018 Intramuscular   Manufacturer: [employer], [employer]   Lot: [id number]   NDC: [address]-3

## 2019-02-19 ENCOUNTER — Ambulatory Visit: Payer: Medicare HMO

## 2019-03-14 ENCOUNTER — Ambulatory Visit: Payer: Medicare HMO | Attending: Internal Medicine

## 2019-03-14 DIAGNOSIS — Z23 Encounter for immunization: Secondary | ICD-10-CM | POA: Insufficient documentation

## 2019-03-14 NOTE — Progress Notes (Signed)
   Covid-19 Vaccination Clinic  Name:  ZYRON DEELEY    MRN: [id number] DOB: 1945/12/27  03/14/2019  Mr. Wingrove was observed post Covid-19 immunization for 15 minutes without incident. He was provided with Vaccine Information Sheet and instruction to access the V-Safe system.   Mr. Hessel was instructed to call 911 with any severe reactions post vaccine: Marland Kitchen Difficulty breathing  . Swelling of face and throat  . A fast heartbeat  . A bad rash all over body  . Dizziness and weakness   Immunizations Administered    Name Date Dose VIS Date Route   Pfizer COVID-19 Vaccine 03/14/2019 11:13 AM 0.3 mL 12/19/2018 Intramuscular   Manufacturer: [employer], [employer]   Lot: [id number]   NDC: [address]-4

## 2020-11-18 ENCOUNTER — Encounter (HOSPITAL_COMMUNITY): Payer: Self-pay

## 2020-11-18 ENCOUNTER — Other Ambulatory Visit: Payer: Self-pay

## 2020-11-18 ENCOUNTER — Ambulatory Visit (HOSPITAL_COMMUNITY)
Admission: EM | Admit: 2020-11-18 | Discharge: 2020-11-18 | Disposition: A | Discharge status: Home / Self Care | Payer: Medicare HMO | Attending: Internal Medicine | Admitting: Internal Medicine

## 2020-11-18 DIAGNOSIS — L02416 Cutaneous abscess of left lower limb: Secondary | ICD-10-CM | POA: Insufficient documentation

## 2020-11-18 DIAGNOSIS — N3001 Acute cystitis with hematuria: Secondary | ICD-10-CM | POA: Insufficient documentation

## 2020-11-18 LAB — POCT URINALYSIS DIPSTICK, ED / UC
Bilirubin Urine: NEGATIVE
Glucose, UA: NEGATIVE mg/dL
Ketones, ur: NEGATIVE mg/dL
Nitrite: POSITIVE — AB
Protein, ur: >=300 mg/dL — AB
Specific Gravity, Urine: 1.025 (ref 1.005–1.030)
Urobilinogen, UA: 1 mg/dL (ref 0.0–1.0)
pH: 7 (ref 5.0–8.0)

## 2020-11-18 MED ORDER — DOXYCYCLINE HYCLATE 100 MG PO CAPS: 100.0000 mg | ORAL_CAPSULE | Freq: Two times a day (BID) | ORAL | 0 refills | Status: AC

## 2020-11-18 NOTE — ED Provider Notes (Signed)
[facility]    CSN: [id number] Arrival date & time: 11/18/20  2330      History   Chief Complaint Chief Complaint  Patient presents with   Urinary Tract Infection    HPI Ryan Navarro is a 75 y.o. male.   HPI Patient presents today for evaluation of urinary tract infection.  Patient is status post prostate surgery x2 weeks ago and reports that his urology had to place a urinary catheter as he was experiencing urinary retention.  He reports blood was elicited with placement of the urinary catheter however he has subsequently developed burning persistently along with irritation involving his urethra.  He also noticed a foul odor of his urine and was concerned of a urinary tract infection.  He reports a history of recurrent urinary tract infections which normally resolved with doxycycline.  He denies any nausea, vomiting, abdominal pain, or low back pain.  He also denies chills or fever.  He is scheduled to see his urologist on Monday.  Past Medical History:  Diagnosis Date   Atrial fibrillation [facility])     Patient Active Problem List   Diagnosis Date Noted   Atrial fibrillation (HCC) 09/21/2011    Past Surgical History:  Procedure Laterality Date   ABLATION     May 28,2013   prostate operation         Home Medications    Prior to Admission medications   Medication Sig Start Date End Date Taking? Authorizing Provider  allopurinol (ZYLOPRIM) 300 MG tablet Take 1 tablet (300 mg total) by mouth daily. Needs office visit/labs 12/15/15   McVey, Madelaine Bhat, PA-C  doxycycline (VIBRAMYCIN) 100 MG capsule Take 1 capsule (100 mg total) by mouth 2 (two) times daily for 10 days. 11/18/20 11/28/20  Bing Neighbors, FNP  enalapril (VASOTEC) 5 MG tablet Take 5 mg by mouth daily. Reported on 05/10/2015    [provider]  magnesium oxide (MAG-OX) 400 MG tablet Take 400 mg by mouth daily. Reported on 05/10/2015    [provider]  metoprolol succinate  (TOPROL-XL) 25 MG 24 hr tablet  11/05/17   [provider]  Multiple Vitamins-Minerals (MULTIVITAMIN WITH MINERALS) tablet Take 1 tablet by mouth daily.    [provider]    Family History Family History  Problem Relation Age of Onset   Cancer Mother    Heart disease Father     Social History Social History   Tobacco Use   Smoking status: Former    Packs/day: 2.00    Years: 20.00    Pack years: 40.00    Types: Cigarettes    Quit date: 11/19/1983    Years since quitting: 37.0   Smokeless tobacco: Never  Substance Use Topics   Alcohol use: No   Drug use: No     Allergies   Patient has no known allergies.   Review of Systems Review of Systems Pertinent negatives listed in HPI   Physical Exam Triage Vital Signs ED Triage Vitals  Enc Vitals Group     BP 11/18/20 0904 (!) 148/72     Pulse Rate 11/18/20 0904 93     Resp 11/18/20 0904 17     Temp 11/18/20 0904 97.9 F (36.6 C)     Temp Source 11/18/20 0904 Oral     SpO2 11/18/20 0904 98 %     Weight --      Height --      Head Circumference --  Peak Flow --      Pain Score 11/18/20 0902 4     Pain Loc --      Pain Edu? --      Excl. in [place]? --    No data found.  Updated Vital Signs BP (!) 148/72 (BP Location: Left Arm)   Pulse 93   Temp 97.9 F (36.6 C) (Oral)   Resp 17   SpO2 98%   Visual Acuity Right Eye Distance:   Left Eye Distance:   Bilateral Distance:    Right Eye Near:   Left Eye Near:    Bilateral Near:     Physical Exam General appearance: Alert, well developed, well nourished, cooperative and  no distress Head: Normocephalic, without obvious abnormality, atraumatic Respiratory: Respirations even and unlabored, normal respiratory rate Heart: Rate and rhythm normal. No gallop or murmurs noted on exam  Extremities: No gross deformities Skin: Skin color, texture, turgor normal. No rashes seen  Psych: Appropriate mood and affect. Neurologic: GCS 15 with no  obvious neurological abnormalities UC Treatments / Results  Labs (all labs ordered are listed, but only abnormal results are displayed) Labs Reviewed  POCT URINALYSIS DIPSTICK, ED / UC - Abnormal; Notable for the following components:      Result Value   Hgb urine dipstick LARGE (*)    Protein, ur >=300 (*)    Nitrite POSITIVE (*)    Leukocytes,Ua SMALL (*)    All other components within normal limits  URINE CULTURE  POCT URINALYSIS DIPSTICK, ED / UC    EKG   Radiology No results found.  Procedures Procedures (including critical care time)  Medications Ordered in UC Medications - No data to display  Initial Impression / Assessment and Plan / UC Course  I have reviewed the triage vital signs and the nursing notes.  Pertinent labs & imaging results that were available during my care of the patient were reviewed by me and considered in my medical decision making (see chart for details).    Acute cystitis with hematuria Treatment with doxycycline twice daily for 10 days given patient has not in-place indwelling catheter. Advised to keep follow-up with urologist on Monday. Drink plenty of water.  Return precautions given if any red flag symptoms develop Final Clinical Impressions(s) / UC Diagnoses   Final diagnoses:  Acute cystitis with hematuria   Discharge Instructions   None    ED Prescriptions     Medication Sig Dispense Auth. Provider   doxycycline (VIBRAMYCIN) 100 MG capsule Take 1 capsule (100 mg total) by mouth 2 (two) times daily for 10 days. 20 capsule Scot Jun, FNP      PDMP not reviewed this encounter.   Scot Jun, [place] 11/18/20 1042

## 2020-11-18 NOTE — ED Triage Notes (Signed)
Pt presents with concerns of UTI. States he has foley catheter in. States he has burning and itching feeling.

## 2020-11-21 ENCOUNTER — Telehealth (HOSPITAL_COMMUNITY): Payer: Self-pay | Admitting: Emergency Medicine

## 2020-11-21 LAB — URINE CULTURE
Culture: >=100000 — AB
Special Requests: NORMAL

## 2020-11-21 MED ORDER — CIPROFLOXACIN HCL 500 MG PO TABS: 500.0000 mg | ORAL_TABLET | Freq: Two times a day (BID) | ORAL | 0 refills | Status: DC

## 2020-11-22 ENCOUNTER — Telehealth (HOSPITAL_COMMUNITY): Payer: Self-pay | Admitting: Emergency Medicine

## 2020-11-22 MED ORDER — CIPROFLOXACIN HCL 500 MG PO TABS: 500.0000 mg | ORAL_TABLET | Freq: Two times a day (BID) | ORAL | 0 refills | Status: AC
